# Patient Record
Sex: Male | Born: 1975 | Race: Black or African American | Hispanic: No | Marital: Married | Smoking: Never smoker
Health system: Southern US, Community
[De-identification: ages and names within clinical notes are randomized; demographics above are authoritative.]

## PROBLEM LIST (undated history)

## (undated) DIAGNOSIS — I10 Essential (primary) hypertension: Secondary | ICD-10-CM

## (undated) DIAGNOSIS — M109 Gout, unspecified: Secondary | ICD-10-CM

---

## 1997-11-30 ENCOUNTER — Emergency Department (HOSPITAL_COMMUNITY): Admission: EM | Admit: 1997-11-30 | Discharge: 1997-11-30 | Payer: Self-pay | Admitting: Emergency Medicine

## 2000-06-11 ENCOUNTER — Emergency Department (HOSPITAL_COMMUNITY): Admission: EM | Admit: 2000-06-11 | Discharge: 2000-06-11 | Payer: Self-pay | Admitting: Emergency Medicine

## 2000-06-13 ENCOUNTER — Emergency Department (HOSPITAL_COMMUNITY): Admission: EM | Admit: 2000-06-13 | Discharge: 2000-06-13 | Payer: Self-pay | Admitting: Emergency Medicine

## 2000-06-15 ENCOUNTER — Emergency Department (HOSPITAL_COMMUNITY): Admission: EM | Admit: 2000-06-15 | Discharge: 2000-06-15 | Payer: Self-pay | Admitting: Internal Medicine

## 2000-08-21 ENCOUNTER — Emergency Department (HOSPITAL_COMMUNITY): Admission: EM | Admit: 2000-08-21 | Discharge: 2000-08-21 | Payer: Self-pay | Admitting: *Deleted

## 2000-12-31 ENCOUNTER — Emergency Department (HOSPITAL_COMMUNITY): Admission: EM | Admit: 2000-12-31 | Discharge: 2001-01-01 | Payer: Self-pay | Admitting: Emergency Medicine

## 2001-06-29 ENCOUNTER — Emergency Department (HOSPITAL_COMMUNITY): Admission: EM | Admit: 2001-06-29 | Discharge: 2001-06-29 | Payer: Self-pay | Admitting: Emergency Medicine

## 2002-12-28 ENCOUNTER — Emergency Department (HOSPITAL_COMMUNITY): Admission: EM | Admit: 2002-12-28 | Discharge: 2002-12-29 | Payer: Self-pay | Admitting: Emergency Medicine

## 2003-10-21 ENCOUNTER — Emergency Department (HOSPITAL_COMMUNITY): Admission: EM | Admit: 2003-10-21 | Discharge: 2003-10-21 | Payer: Self-pay | Admitting: *Deleted

## 2003-10-23 ENCOUNTER — Emergency Department (HOSPITAL_COMMUNITY): Admission: EM | Admit: 2003-10-23 | Discharge: 2003-10-23 | Payer: Self-pay | Admitting: Emergency Medicine

## 2017-05-13 ENCOUNTER — Encounter (HOSPITAL_COMMUNITY): Payer: Self-pay | Admitting: Emergency Medicine

## 2017-05-13 ENCOUNTER — Ambulatory Visit (HOSPITAL_COMMUNITY)
Admission: EM | Admit: 2017-05-13 | Discharge: 2017-05-13 | Disposition: A | Discharge status: Home / Self Care | Payer: Self-pay | Attending: Emergency Medicine | Admitting: Emergency Medicine

## 2017-05-13 ENCOUNTER — Ambulatory Visit (INDEPENDENT_AMBULATORY_CARE_PROVIDER_SITE_OTHER): Payer: Self-pay

## 2017-05-13 DIAGNOSIS — M25461 Effusion, right knee: Secondary | ICD-10-CM

## 2017-05-13 DIAGNOSIS — S838X1A Sprain of other specified parts of right knee, initial encounter: Secondary | ICD-10-CM

## 2017-05-13 HISTORY — DX: Essential (primary) hypertension: I10

## 2017-05-13 MED ORDER — MELOXICAM 15 MG PO TABS: 15.0000 mg | ORAL_TABLET | Freq: Every day | ORAL | 0 refills | Status: AC

## 2017-05-13 NOTE — ED Provider Notes (Signed)
[facility]    CSN: [id number] Arrival date & time: 05/13/17  1810     History   Chief Complaint Chief Complaint  Patient presents with  . Leg Pain    HPI Benjamin Meyer is a 41 y.o. male.   41 year-old male, with history of HTN, presenting today complaining of right knee pain. He states that they recently moved and he was moving several things up and down the stairs. He has right sided medial knee pain since that time. walking up and down stairs exacerbates the pain. He has been taking motrin at home with mild relief. Denies redness, warmth, swelling in the right calf. Denies history of DVT/PE   The history is provided by the patient.  Leg Pain  Location:  Knee Time since incident:  1 month Injury: no   Knee location:  R knee Pain details:    Quality:  Aching   Radiates to:  Does not radiate   Severity:  Moderate   Onset quality:  Gradual   Duration:  1 month   Timing:  Intermittent   Progression:  Waxing and waning Chronicity:  New Dislocation: no   Foreign body present:  No foreign bodies Tetanus status:  Unknown Prior injury to area:  No Relieved by:  Nothing Worsened by:  Activity, flexion and exercise Ineffective treatments:  Rest Associated symptoms: stiffness   Associated symptoms: no back pain, no decreased ROM, no fatigue, no fever, no itching, no muscle weakness, no neck pain, no numbness, no swelling and no tingling   Risk factors: obesity   Risk factors: no concern for non-accidental trauma, no frequent fractures and no known bone disorder     Past Medical History:  Diagnosis Date  . Hypertension     There are no active problems to display for this patient.   History reviewed. No pertinent surgical history.     Home Medications    Prior to Admission medications   Medication Sig Start Date End Date Taking? Authorizing Provider  lisinopril-hydrochlorothiazide (PRINZIDE,ZESTORETIC) 20-12.5 MG tablet Take 1 tablet by mouth  daily.   Yes [provider]  meloxicam (MOBIC) 15 MG tablet Take 1 tablet (15 mg total) by mouth daily. 05/13/17 06/12/17  Alecia Lemming, PA-C    Family History History reviewed. No pertinent family history.  Social History Social History  Substance Use Topics  . Smoking status: Never Smoker  . Smokeless tobacco: Never Used  . Alcohol use Yes     Comment: occ     Allergies   Patient has no known allergies.   Review of Systems Review of Systems  Constitutional: Negative for chills, fatigue and fever.  HENT: Negative for ear pain and sore throat.   Eyes: Negative for pain and visual disturbance.  Respiratory: Negative for cough and shortness of breath.   Cardiovascular: Negative for chest pain and palpitations.  Gastrointestinal: Negative for abdominal pain and vomiting.  Genitourinary: Negative for dysuria and hematuria.  Musculoskeletal: Positive for arthralgias (right knee ) and stiffness. Negative for back pain, joint swelling and neck pain.  Skin: Negative for color change, itching and rash.  Neurological: Negative for seizures and syncope.  All other systems reviewed and are negative.    Physical Exam Triage Vital Signs ED Triage Vitals [05/13/17 1850]  Enc Vitals Group     BP (!) 166/97     Pulse Rate (!) 102     Resp 18     Temp 97.6 F (36.4 C)  Temp Source Oral     SpO2 97 %     Weight      Height      Head Circumference      Peak Flow      Pain Score 7     Pain Loc      Pain Edu?      Excl. in GC?    No data found.   Updated Vital Signs BP (!) 166/97 (BP Location: Right Arm)   Pulse (!) 102   Temp 97.6 F (36.4 C) (Oral)   Resp 18   SpO2 97%   Visual Acuity Right Eye Distance:   Left Eye Distance:   Bilateral Distance:    Right Eye Near:   Left Eye Near:    Bilateral Near:     Physical Exam  Constitutional: He appears well-developed and well-nourished.  HENT:  Head: Normocephalic and atraumatic.  Eyes:  Conjunctivae are normal.  Neck: Neck supple.  Cardiovascular: Normal rate and regular rhythm.   No murmur heard. Pulmonary/Chest: Effort normal and breath sounds normal. No respiratory distress.  Abdominal: Soft. There is no tenderness.  Musculoskeletal: He exhibits no edema.       Right knee: He exhibits normal range of motion and normal patellar mobility. Tenderness found. Medial joint line tenderness noted.  Tenderness to palpation of the medial joint line of the right knee. No swelling, erythema, warmth noted. Pain with valgus stress. Negative anterior drawer.   Neurological: He is alert.  Skin: Skin is warm and dry.  Psychiatric: He has a normal mood and affect.  Nursing note and vitals reviewed.    UC Treatments / Results  Labs (all labs ordered are listed, but only abnormal results are displayed) Labs Reviewed - No data to display  EKG  EKG Interpretation None       Radiology Dg Knee Complete 4 Views Right  Result Date: 05/13/2017 CLINICAL DATA:  41 year old male with right knee pain. No known injury. EXAM: RIGHT KNEE - COMPLETE 4+ VIEW COMPARISON:  None. FINDINGS: There is no acute fracture or dislocation. The bones are well mineralized. No arthritic changes. Trace suprapatellar effusion noted. The soft tissues appear unremarkable. IMPRESSION: Negative. Electronically Signed   By: Elgie CollardArash  Radparvar M.D.   On: 05/13/2017 20:14    Procedures Procedures (including critical care time)  Medications Ordered in UC Medications - No data to display   Initial Impression / Assessment and Plan / UC Course  I have reviewed the triage vital signs and the nursing notes.  Pertinent labs & imaging results that were available during my care of the patient were reviewed by me and considered in my medical decision making (see chart for details).     Right knee pain after moving Positive valgus stress, likely injury to the medial meniscus - RICE, NSAIDs and ortho referral  Final  Clinical Impressions(s) / UC Diagnoses   Final diagnoses:  Sprain of medial meniscus of right knee, initial encounter    New Prescriptions New Prescriptions   MELOXICAM (MOBIC) 15 MG TABLET    Take 1 tablet (15 mg total) by mouth daily.     Controlled Substance Prescriptions [place] Controlled Substance Registry consulted? Not Applicable   Alecia LemmingBlue, Cadyn Fann C, [place]PA-C 05/13/17 2025

## 2017-05-13 NOTE — ED Triage Notes (Signed)
Pt sts right leg pain in lower part x 1 month since moving and going up and down stairs; pt sts mild swelling

## 2017-09-09 ENCOUNTER — Ambulatory Visit (HOSPITAL_COMMUNITY)
Admission: EM | Admit: 2017-09-09 | Discharge: 2017-09-09 | Disposition: A | Discharge status: Home / Self Care | Payer: Self-pay | Attending: Family Medicine | Admitting: Family Medicine

## 2017-09-09 ENCOUNTER — Encounter (HOSPITAL_COMMUNITY): Payer: Self-pay

## 2017-09-09 DIAGNOSIS — I10 Essential (primary) hypertension: Secondary | ICD-10-CM

## 2017-09-09 DIAGNOSIS — M109 Gout, unspecified: Secondary | ICD-10-CM

## 2017-09-09 MED ORDER — INDOMETHACIN 50 MG PO CAPS: 50.0000 mg | ORAL_CAPSULE | Freq: Two times a day (BID) | ORAL | 0 refills | Status: DC

## 2017-09-09 MED ORDER — METHYLPREDNISOLONE SODIUM SUCC 125 MG IJ SOLR
125.0000 mg | Freq: Once | INTRAMUSCULAR | Status: AC
  Administered 2017-09-09: 125 mg via INTRAMUSCULAR

## 2017-09-09 MED ORDER — LISINOPRIL-HYDROCHLOROTHIAZIDE 20-12.5 MG PO TABS: 1.0000 | ORAL_TABLET | Freq: Every day | ORAL | 1 refills | Status: DC

## 2017-09-09 MED ORDER — METHYLPREDNISOLONE SODIUM SUCC 125 MG IJ SOLR
INTRAMUSCULAR | Status: AC
  Filled 2017-09-09: qty 2

## 2017-09-09 NOTE — ED Provider Notes (Signed)
  [facility]   [id number] 09/09/17 Arrival Time: 1039  ASSESSMENT & PLAN:  1. Acute gout involving toe of left foot, unspecified cause   2. Essential hypertension     Meds ordered this encounter  Medications  . methylPREDNISolone sodium succinate (SOLU-MEDROL) 125 mg/2 mL injection 125 mg  . indomethacin (INDOCIN) 50 MG capsule    Sig: Take 1 capsule (50 mg total) by mouth 2 (two) times daily with a meal.    Dispense:  10 capsule    Refill:  0  . lisinopril-hydrochlorothiazide (PRINZIDE,ZESTORETIC) 20-12.5 MG tablet    Sig: Take 1 tablet by mouth daily.    Dispense:  30 tablet    Refill:  1   Will start his BP medicine gain. Recommend that he f/u here within one week for BP check. PCP information given.  Reviewed expectations re: course of current medical issues. Questions answered. Outlined signs and symptoms indicating need for more acute intervention. Patient verbalized understanding. After Visit Summary given.  SUBJECTIVE: History from: patient. Benjamin Meyer is a 42 y.o. male who reports localized pain to his L great toe. H/O gout "and it feels like it." Cannot remember last gout flare. OTC Tylenol without much help. Ambulatory. No injury reported. Day #2 of symptoms. No new medications. Associated symptoms: none reported. Extremity sensation changes or weakness: none. "I usually get a shot and that helps. Need a shot."  Also requests refill of HTN med. Has been out for several weeks or longer.  ROS: As per HPI.   OBJECTIVE:  Vitals:   09/09/17 1146 09/09/17 1149  BP:  (!) 190/119  Pulse: 90   Resp: 18   Temp: 97.8 F (36.6 C)   TempSrc: Oral   SpO2: 97%     General appearance: alert; no distress Extremities: no cyanosis or edema; symmetrical with no gross deformities; localized tenderness over his L great MTP joint with slight swelling and erythema CV: normal extremity capillary refill Skin: warm and dry Neurologic: normal gait but favors L  foot; normal symmetric reflexes in all extremities; normal sensation in all extremities Psychological: alert and cooperative; normal mood and affect  No Known Allergies  Past Medical History:  Diagnosis Date  . Hypertension    Social History   Socioeconomic History  . Marital status: Married    Spouse name: Not on file  . Number of children: Not on file  . Years of education: Not on file  . Highest education level: Not on file  Social Needs  . Financial resource strain: Not on file  . Food insecurity - worry: Not on file  . Food insecurity - inability: Not on file  . Transportation needs - medical: Not on file  . Transportation needs - non-medical: Not on file  Occupational History  . Not on file  Tobacco Use  . Smoking status: Never Smoker  . Smokeless tobacco: Never Used  Substance and Sexual Activity  . Alcohol use: Yes    Comment: occ  . Drug use: No  . Sexual activity: Not on file  Other Topics Concern  . Not on file  Social History Narrative  . Not on file    History reviewed. No pertinent surgical history.    Mardella LaymanHagler, Sabryna Lahm, MD 09/09/17 1233

## 2017-09-09 NOTE — ED Triage Notes (Signed)
Pt having a gout flare on his left foot since last night. Complains of swelling and pain. Has a hx of gout. No medication left. Would like an injection.

## 2017-09-09 NOTE — Discharge Instructions (Addendum)
Start taking your blood pressure medicines again.

## 2017-11-27 ENCOUNTER — Ambulatory Visit (HOSPITAL_COMMUNITY)
Admission: EM | Admit: 2017-11-27 | Discharge: 2017-11-27 | Disposition: A | Discharge status: Home / Self Care | Payer: Self-pay | Attending: Family Medicine | Admitting: Family Medicine

## 2017-11-27 ENCOUNTER — Other Ambulatory Visit: Payer: Self-pay

## 2017-11-27 ENCOUNTER — Encounter (HOSPITAL_COMMUNITY): Payer: Self-pay | Admitting: Emergency Medicine

## 2017-11-27 DIAGNOSIS — M109 Gout, unspecified: Secondary | ICD-10-CM

## 2017-11-27 DIAGNOSIS — I10 Essential (primary) hypertension: Secondary | ICD-10-CM

## 2017-11-27 MED ORDER — COLCHICINE 0.6 MG PO TABS: ORAL_TABLET | ORAL | 2 refills | Status: DC

## 2017-11-27 MED ORDER — INDOMETHACIN 50 MG PO CAPS: 50.0000 mg | ORAL_CAPSULE | Freq: Two times a day (BID) | ORAL | 0 refills | Status: DC

## 2017-11-27 MED ORDER — LISINOPRIL-HYDROCHLOROTHIAZIDE 20-12.5 MG PO TABS: 1.0000 | ORAL_TABLET | Freq: Every day | ORAL | 1 refills | Status: DC

## 2017-11-27 MED ORDER — METHYLPREDNISOLONE SODIUM SUCC 125 MG IJ SOLR
INTRAMUSCULAR | Status: AC
  Filled 2017-11-27: qty 2

## 2017-11-27 MED ORDER — METHYLPREDNISOLONE SODIUM SUCC 125 MG IJ SOLR
125.0000 mg | Freq: Once | INTRAMUSCULAR | Status: AC
  Administered 2017-11-27: 125 mg via INTRAMUSCULAR

## 2017-11-27 NOTE — ED Provider Notes (Signed)
[facility]   [id number] 11/27/17 Arrival Time: 1748  ASSESSMENT & PLAN:  1. Acute gout involving toe of left foot, unspecified cause   2. Essential hypertension    Meds ordered this encounter  Medications  . indomethacin (INDOCIN) 50 MG capsule    Sig: Take 1 capsule (50 mg total) by mouth 2 (two) times daily with a meal.    Dispense:  10 capsule    Refill:  0  . lisinopril-hydrochlorothiazide (PRINZIDE,ZESTORETIC) 20-12.5 MG tablet    Sig: Take 1 tablet by mouth daily.    Dispense:  30 tablet    Refill:  1  . colchicine 0.6 MG tablet    Sig: Take two tablets as one dose followed one hour later by one tablet.    Dispense:  3 tablet    Refill:  2  . methylPREDNISolone sodium succinate (SOLU-MEDROL) 125 mg/2 mL injection 125 mg   Encouraged that he establish care with PCP. May f/u here as needed.  Reviewed expectations re: course of current medical issues. Questions answered. Outlined signs and symptoms indicating need for more acute intervention. Patient verbalized understanding. After Visit Summary given.  SUBJECTIVE: History from: patient. Benjamin Meyer is a 42 y.o. male who reports persistent moderate pain of his left first toe that is stable; described as aching and throbbing without radiation. Onset: abrupt, 1-2 days ago. Injury/trama: no. Relieved by: nothing in particular. Worsened by: certain movements. Associated symptoms: none reported. Extremity sensation changes or weakness: none. Self treatment: tried OTCs without relief of pain. History of similar: yes, "feels like gout" Able to bear weight on foot with discomfort.  ROS: As per HPI.   OBJECTIVE:  Vitals:   11/27/17 1850  BP: (!) 170/114  Pulse: 85  Resp: 20  Temp: 98.3 F (36.8 C)  TempSrc: Oral  SpO2: 97%    General appearance: alert; no distress Extremities: warm and well perfused; symmetrical with no gross deformities; localized tenderness over his left first MTP joint with  mild swelling and no bruising; ROM: normal but with pain CV: normal extremity capillary refill Skin: warm and dry Neurologic: normal gait; normal symmetric reflexes in all extremities; normal sensation in all extremities Psychological: alert and cooperative; normal mood and affect  No Known Allergies  Past Medical History:  Diagnosis Date  . Hypertension    Social History   Socioeconomic History  . Marital status: Married    Spouse name: Not on file  . Number of children: Not on file  . Years of education: Not on file  . Highest education level: Not on file  Occupational History  . Not on file  Social Needs  . Financial resource strain: Not on file  . Food insecurity:    Worry: Not on file    Inability: Not on file  . Transportation needs:    Medical: Not on file    Non-medical: Not on file  Tobacco Use  . Smoking status: Never Smoker  . Smokeless tobacco: Never Used  Substance and Sexual Activity  . Alcohol use: Yes    Comment: occ  . Drug use: No  . Sexual activity: Not on file  Lifestyle  . Physical activity:    Days per week: Not on file    Minutes per session: Not on file  . Stress: Not on file  Relationships  . Social connections:    Talks on phone: Not on file    Gets together: Not on file    Attends religious service:  Not on file    Active member of club or organization: Not on file    Attends meetings of clubs or organizations: Not on file    Relationship status: Not on file  . Intimate partner violence:    Fear of current or ex partner: Not on file    Emotionally abused: Not on file    Physically abused: Not on file    Forced sexual activity: Not on file  Other Topics Concern  . Not on file  Social History Narrative  . Not on file   History reviewed. No pertinent family history. History reviewed. No pertinent surgical history.    Mardella Layman, MD 12/02/17 [phone]

## 2017-11-27 NOTE — ED Triage Notes (Signed)
The patient presented to the [facility] with a complaint of left foot pain that he believed to be gout. The patient denied any known injury and has a hx of gout.

## 2018-05-20 ENCOUNTER — Encounter (HOSPITAL_COMMUNITY): Payer: Self-pay | Admitting: Emergency Medicine

## 2018-05-20 ENCOUNTER — Ambulatory Visit (HOSPITAL_COMMUNITY)
Admission: EM | Admit: 2018-05-20 | Discharge: 2018-05-20 | Disposition: A | Discharge status: Home / Self Care | Payer: Self-pay | Attending: Family Medicine | Admitting: Family Medicine

## 2018-05-20 DIAGNOSIS — I10 Essential (primary) hypertension: Secondary | ICD-10-CM

## 2018-05-20 DIAGNOSIS — M109 Gout, unspecified: Secondary | ICD-10-CM

## 2018-05-20 HISTORY — DX: Gout, unspecified: M10.9

## 2018-05-20 MED ORDER — METHYLPREDNISOLONE SODIUM SUCC 125 MG IJ SOLR
INTRAMUSCULAR | Status: AC
  Filled 2018-05-20: qty 2

## 2018-05-20 MED ORDER — COLCHICINE 0.6 MG PO TABS: ORAL_TABLET | ORAL | 2 refills | Status: DC

## 2018-05-20 MED ORDER — INDOMETHACIN 50 MG PO CAPS: 50.0000 mg | ORAL_CAPSULE | Freq: Two times a day (BID) | ORAL | 0 refills | Status: DC

## 2018-05-20 MED ORDER — AMLODIPINE BESYLATE 10 MG PO TABS: 10.0000 mg | ORAL_TABLET | Freq: Every day | ORAL | 1 refills | Status: DC

## 2018-05-20 MED ORDER — METHYLPREDNISOLONE SODIUM SUCC 125 MG IJ SOLR
125.0000 mg | Freq: Once | INTRAMUSCULAR | Status: AC
  Administered 2018-05-20: 125 mg via INTRAMUSCULAR

## 2018-05-20 NOTE — ED Triage Notes (Signed)
Pt here for pain in left foot from gout; pt sts hx of same

## 2018-05-20 NOTE — ED Provider Notes (Signed)
[facility]   [id number] 05/20/18 Arrival Time: 1603  ASSESSMENT & PLAN:  1. Acute gout of left foot, unspecified cause   2. Uncontrolled hypertension    Refill gout medications. Long discussion over HTN and potential effects to his long-term health. Agrees to start Norvasc and follow up here for BP check. Will look for PCP. I placed him in our queue for PCP assistance.  Meds ordered this encounter  Medications  . amLODipine (NORVASC) 10 MG tablet    Sig: Take 1 tablet (10 mg total) by mouth daily.    Dispense:  30 tablet    Refill:  1  . indomethacin (INDOCIN) 50 MG capsule    Sig: Take 1 capsule (50 mg total) by mouth 2 (two) times daily with a meal.    Dispense:  10 capsule    Refill:  0  . colchicine 0.6 MG tablet    Sig: Take two tablets as one dose followed one hour later by one tablet.    Dispense:  3 tablet    Refill:  2  . methylPREDNISolone sodium succinate (SOLU-MEDROL) 125 mg/2 mL injection 125 mg    Follow-up Information    [facility] In 2 weeks.   Specialty:  Urgent Care Why:  For blood pressure recheck. Contact information: [address] [place] [address] [phone]  To recheck BP and check Cr.       Reviewed expectations re: course of current medical issues. Questions answered. Outlined signs and symptoms indicating need for more acute intervention. Patient verbalized understanding. After Visit Summary given.   SUBJECTIVE:  Benjamin Meyer is a 42 y.o. male who reports recurrence of gout of his L foot. Last episode approx 5-6 months ago and responded to treatment. Requests refills of his medications. Ambulatory with pain. Also keeping him up at night. Current symptoms with gradual onset over the past few days. No recent illnesses or new medications. Denies frequent alcohol use.  Also knows his BP has been elevated for quite some time. Is taking the same BP med that he has been on for  a few years. No regular check ups for BP. Does not have a PCP. He reports taking medications as instructed, no medication side effects noted, no chest pain on exertion, no dyspnea on exertion and no swelling of ankles. Denies symptoms of chest pain, palpations, orthopnea, nocturnal dyspnea, or LE edema. Weight stable. No LE edema.  Social History   Tobacco Use  Smoking Status Never Smoker  Smokeless Tobacco Never Used    ROS: As per HPI. All other systems negative.   OBJECTIVE:  Vitals:   05/20/18 1739  BP: (!) 215/125  Pulse: 95  Resp: 18  Temp: 98.3 F (36.8 C)  TempSrc: Oral  SpO2: 95%    Elevated BP noted.  General appearance: alert; no distress; morbidly obese Eyes: PERRLA; EOMI HENT: normocephalic; atraumatic Neck: supple Lungs: clear to auscultation bilaterally Heart: regular rate and rhythm without murmer Extremities: refuses to remove shoe "because I won't be able to get it back on"; visible ankle without swelling or erythema; FROM Skin: warm and dry Psychological: alert and cooperative; normal mood and affect  No Known Allergies  Past Medical History:  Diagnosis Date  . Gout   . Hypertension   No h/o kidney disease reported.  Social History   Socioeconomic History  . Marital status: Married    Spouse name: Not on file  . Number of children: Not  on file  . Years of education: Not on file  . Highest education level: Not on file  Occupational History  . Not on file  Social Needs  . Financial resource strain: Not on file  . Food insecurity:    Worry: Not on file    Inability: Not on file  . Transportation needs:    Medical: Not on file    Non-medical: Not on file  Tobacco Use  . Smoking status: Never Smoker  . Smokeless tobacco: Never Used  Substance and Sexual Activity  . Alcohol use: Yes    Comment: occ  . Drug use: No  . Sexual activity: Not on file  Lifestyle  . Physical activity:    Days per week: Not on file    Minutes per  session: Not on file  . Stress: Not on file  Relationships  . Social connections:    Talks on phone: Not on file    Gets together: Not on file    Attends religious service: Not on file    Active member of club or organization: Not on file    Attends meetings of clubs or organizations: Not on file    Relationship status: Not on file  . Intimate partner violence:    Fear of current or ex partner: Not on file    Emotionally abused: Not on file    Physically abused: Not on file    Forced sexual activity: Not on file  Other Topics Concern  . Not on file  Social History Narrative  . Not on file   FH: HTN.  History reviewed. No pertinent surgical history.    Mardella Layman, MD 05/21/18 [phone]

## 2019-01-18 ENCOUNTER — Ambulatory Visit (HOSPITAL_COMMUNITY)
Admission: EM | Admit: 2019-01-18 | Discharge: 2019-01-18 | Disposition: A | Discharge status: Home / Self Care | Payer: Self-pay | Attending: Family Medicine | Admitting: Family Medicine

## 2019-01-18 ENCOUNTER — Other Ambulatory Visit: Payer: Self-pay

## 2019-01-18 ENCOUNTER — Encounter (HOSPITAL_COMMUNITY): Payer: Self-pay | Admitting: *Deleted

## 2019-01-18 DIAGNOSIS — I10 Essential (primary) hypertension: Secondary | ICD-10-CM

## 2019-01-18 DIAGNOSIS — M5431 Sciatica, right side: Secondary | ICD-10-CM

## 2019-01-18 DIAGNOSIS — Z760 Encounter for issue of repeat prescription: Secondary | ICD-10-CM

## 2019-01-18 DIAGNOSIS — Z8739 Personal history of other diseases of the musculoskeletal system and connective tissue: Secondary | ICD-10-CM

## 2019-01-18 MED ORDER — INDOMETHACIN 50 MG PO CAPS: 50.0000 mg | ORAL_CAPSULE | Freq: Two times a day (BID) | ORAL | 0 refills | Status: DC

## 2019-01-18 MED ORDER — LISINOPRIL-HYDROCHLOROTHIAZIDE 20-12.5 MG PO TABS: 1.0000 | ORAL_TABLET | Freq: Every day | ORAL | 1 refills | Status: DC

## 2019-01-18 MED ORDER — AMLODIPINE BESYLATE 10 MG PO TABS: 10.0000 mg | ORAL_TABLET | Freq: Every day | ORAL | 1 refills | Status: DC

## 2019-01-18 MED ORDER — COLCHICINE 0.6 MG PO TABS: ORAL_TABLET | ORAL | 2 refills | Status: DC

## 2019-01-18 MED ORDER — IBUPROFEN 800 MG PO TABS: 800.0000 mg | ORAL_TABLET | Freq: Three times a day (TID) | ORAL | 0 refills | Status: DC | PRN

## 2019-01-18 MED ORDER — METHYLPREDNISOLONE 4 MG PO TBPK: ORAL_TABLET | ORAL | 0 refills | Status: DC

## 2019-01-18 NOTE — Discharge Instructions (Addendum)
Go back on the BP medicines Take gout medicine as needed Take the medrol pak as directed Take all of day one today This will help with the sciatica After finishing the medrol you may take ibuprofen as needed

## 2019-01-18 NOTE — ED Triage Notes (Signed)
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  C/O flare-up of pain x approx 1 month due to standing for long periods of time at work; has run out of Rx IBU.  Denies numbness/tingling.

## 2019-01-18 NOTE — ED Provider Notes (Signed)
[facility]    CSN: [id number] Arrival date & time: 01/18/19  1544      History   Chief Complaint Chief Complaint  Patient presents with  . Hip Pain    HPI Benjamin Meyer is a 43 y.o. male.   HPI  Has recurring sciatica.  He takes ibuprofen 800 mg for this.  This usually works for him.  He is out of this medication.  He is out of his blood pressure medicines.  He is out of his Medicines.  He is asking for refills of all of these things.  The importance of maintaining a relationship with a primary care doctor to get regular refills is emphasized to patient.  He states he stands for a long time at his job.  Currently has pain in his right posterior buttock that goes down the right posterior thigh.  Worse with activity.  Better with rest.  No injury, no fall, no trauma.  No bowel or bladder complaint.  No numbness or weakness.  Past Medical History:  Diagnosis Date  . Gout   . Hypertension     There are no active problems to display for this patient.   History reviewed. No pertinent surgical history.     Home Medications    Prior to Admission medications   Medication Sig Start Date End Date Taking? Authorizing Provider  amLODipine (NORVASC) 10 MG tablet Take 1 tablet (10 mg total) by mouth daily. 01/18/19   Eustace MooreNelson, Yvonne Sue, MD  colchicine 0.6 MG tablet Take two tablets as one dose followed one hour later by one tablet. 01/18/19   Eustace MooreNelson, Yvonne Sue, MD  ibuprofen (ADVIL) 800 MG tablet Take 1 tablet (800 mg total) by mouth every 8 (eight) hours as needed for moderate pain. 01/18/19   Eustace MooreNelson, Yvonne Sue, MD  indomethacin (INDOCIN) 50 MG capsule Take 1 capsule (50 mg total) by mouth 2 (two) times daily with a meal. 01/18/19   Eustace MooreNelson, Yvonne Sue, MD  lisinopril-hydrochlorothiazide (ZESTORETIC) 20-12.5 MG tablet Take 1 tablet by mouth daily. 01/18/19   Eustace MooreNelson, Yvonne Sue, MD  methylPREDNISolone (MEDROL DOSEPAK) 4 MG TBPK tablet tad 01/18/19   Eustace MooreNelson, Yvonne Sue, MD    Family  History Family History  Problem Relation Age of Onset  . Hypertension Mother   . Diabetes Mother   . Heart failure Father   . Hypertension Father     Social History Social History   Tobacco Use  . Smoking status: Never Smoker  . Smokeless tobacco: Never Used  Substance Use Topics  . Alcohol use: Yes    Comment: occasionally  . Drug use: No     Allergies   Patient has no known allergies.   Review of Systems Review of Systems  Constitutional: Negative for chills and fever.  HENT: Negative for ear pain and sore throat.   Eyes: Negative for pain and visual disturbance.  Respiratory: Negative for cough and shortness of breath.   Cardiovascular: Negative for chest pain and palpitations.  Gastrointestinal: Negative for abdominal pain and vomiting.  Genitourinary: Negative for dysuria and hematuria.  Musculoskeletal: Positive for back pain. Negative for arthralgias.  Skin: Negative for color change and rash.  Neurological: Negative for seizures and syncope.  All other systems reviewed and are negative.    Physical Exam Triage Vital Signs ED Triage Vitals  Enc Vitals Group     BP 01/18/19 1607 128/84     Pulse Rate 01/18/19 1607 91     Resp 01/18/19  1607 (!) 24     Temp 01/18/19 1607 98 F (36.7 C)     Temp Source 01/18/19 1607 Oral     SpO2 01/18/19 1607 96 %     Weight --      Height --      Head Circumference --      Peak Flow --      Pain Score 01/18/19 1609 4     Pain Loc --      Pain Edu? --      Excl. in [place]? --    No data found.  Updated Vital Signs BP 128/84   Pulse 91   Temp 98 F (36.7 C) (Oral)   Resp (!) 24   SpO2 96%     Physical Exam Constitutional:      General: He is not in acute distress.    Appearance: He is well-developed. He is obese.     Comments: Morbidly obese  HENT:     Head: Normocephalic and atraumatic.  Eyes:     Conjunctiva/sclera: Conjunctivae normal.     Pupils: Pupils are equal, round, and reactive to light.   Neck:     Musculoskeletal: Normal range of motion.  Cardiovascular:     Rate and Rhythm: Normal rate and regular rhythm.     Comments: Heart sounds distant Pulmonary:     Effort: Pulmonary effort is normal. No respiratory distress.     Breath sounds: Normal breath sounds.  Abdominal:     General: There is no distension.     Palpations: Abdomen is soft.  Musculoskeletal: Normal range of motion.        General: No swelling, tenderness or deformity.  Skin:    General: Skin is warm and dry.  Neurological:     Mental Status: He is alert.     Motor: No weakness.     Coordination: Coordination normal.     Gait: Gait normal.     Deep Tendon Reflexes: Reflexes normal.      UC Treatments / Results  Labs (all labs ordered are listed, but only abnormal results are displayed) Labs Reviewed - No data to display  EKG   Radiology No results found.  Procedures Procedures (including critical care time)  Medications Ordered in UC Medications - No data to display  Initial Impression / Assessment and Plan / UC Course  I have reviewed the triage vital signs and the nursing notes.  Pertinent labs & imaging results that were available during my care of the patient were reviewed by me and considered in my medical decision making (see chart for details).      Final Clinical Impressions(s) / UC Diagnoses   Final diagnoses:  Sciatica of right side  Essential hypertension  Personal history of gout     Discharge Instructions     Go back on the BP medicines Take gout medicine as needed Take the medrol pak as directed Take all of day one today This will help with the sciatica After finishing the medrol you may take ibuprofen as needed    ED Prescriptions    Medication Sig Dispense Auth. Provider   indomethacin (INDOCIN) 50 MG capsule Take 1 capsule (50 mg total) by mouth 2 (two) times daily with a meal. 10 capsule Raylene Everts, MD   colchicine 0.6 MG tablet Take two  tablets as one dose followed one hour later by one tablet. 3 tablet Raylene Everts, MD   amLODipine (NORVASC) 10 MG tablet  Take 1 tablet (10 mg total) by mouth daily. 30 tablet Eustace MooreNelson, Yvonne Sue, MD   lisinopril-hydrochlorothiazide (ZESTORETIC) 20-12.5 MG tablet Take 1 tablet by mouth daily. 30 tablet Eustace MooreNelson, Yvonne Sue, MD   ibuprofen (ADVIL) 800 MG tablet Take 1 tablet (800 mg total) by mouth every 8 (eight) hours as needed for moderate pain. 90 tablet Eustace MooreNelson, Yvonne Sue, MD   methylPREDNISolone (MEDROL DOSEPAK) 4 MG TBPK tablet tad 21 tablet Eustace MooreNelson, Yvonne Sue, MD     Controlled Substance Prescriptions [place] Controlled Substance Registry consulted? Not Applicable   Eustace MooreNelson, Yvonne Sue, MD 01/18/19 [phone]

## 2019-03-11 ENCOUNTER — Ambulatory Visit (HOSPITAL_COMMUNITY)
Admission: EM | Admit: 2019-03-11 | Discharge: 2019-03-11 | Disposition: A | Discharge status: Home / Self Care | Payer: Self-pay | Attending: Internal Medicine | Admitting: Internal Medicine

## 2019-03-11 ENCOUNTER — Other Ambulatory Visit: Payer: Self-pay

## 2019-03-11 ENCOUNTER — Encounter (HOSPITAL_COMMUNITY): Payer: Self-pay

## 2019-03-11 DIAGNOSIS — M25551 Pain in right hip: Secondary | ICD-10-CM

## 2019-03-11 DIAGNOSIS — I160 Hypertensive urgency: Secondary | ICD-10-CM

## 2019-03-11 DIAGNOSIS — Z202 Contact with and (suspected) exposure to infections with a predominantly sexual mode of transmission: Secondary | ICD-10-CM

## 2019-03-11 MED ORDER — CLONIDINE HCL 0.1 MG PO TABS
ORAL_TABLET | ORAL | Status: AC
  Filled 2019-03-11: qty 1

## 2019-03-11 MED ORDER — METHYLPREDNISOLONE 4 MG PO TBPK: ORAL_TABLET | ORAL | 0 refills | Status: DC

## 2019-03-11 MED ORDER — METRONIDAZOLE 500 MG PO TABS: 500.0000 mg | ORAL_TABLET | Freq: Two times a day (BID) | ORAL | 0 refills | Status: DC

## 2019-03-11 MED ORDER — CLONIDINE HCL 0.1 MG PO TABS
0.1000 mg | ORAL_TABLET | Freq: Once | ORAL | Status: AC
  Administered 2019-03-11: 0.1 mg via ORAL

## 2019-03-11 MED ORDER — IBUPROFEN 800 MG PO TABS: 800.0000 mg | ORAL_TABLET | Freq: Three times a day (TID) | ORAL | 0 refills | Status: DC | PRN

## 2019-03-11 NOTE — ED Triage Notes (Signed)
Patient presents to Urgent Care with complaints of right hip pain since 1 month. Pt reports it is sciatic pain and was seen one month ago for this and was prescribed medication that helped. Pt states it continues to hurt without the medication.

## 2019-03-11 NOTE — ED Provider Notes (Signed)
[facility]    CSN: [id number] Arrival date & time: 03/11/19  1800      History   Chief Complaint Chief Complaint  Patient presents with  . Hip Pain    HPI Benjamin Meyer is a 43 y.o. male a history of chronic right hip pain, hypertension on antihypertensive medications comes to urgent care with complaint of worsening right hip pain.  Patient has a history of sciatica which is usually controlled with Motrin.  Patient says that he has had worsening pain over the past few days.  Pain is in the right hip with no radiation into the legs.  Aggravated by prolonged standing or movement.  Relieved by NSAIDs.  Patient denies any numbness or tingling.  Sexual partner was evaluated for STDs and she tested positive for trichomonas.  Patient has no urethral discharge, dysuria urgency or frequency.  No testicular or groin pain.  Patient denies any headache, dizziness, chest pain or chest pressure.  HPI  Past Medical History:  Diagnosis Date  . Gout   . Hypertension     There are no active problems to display for this patient.   History reviewed. No pertinent surgical history.     Home Medications    Prior to Admission medications   Medication Sig Start Date End Date Taking? Authorizing Provider  amLODipine (NORVASC) 10 MG tablet Take 1 tablet (10 mg total) by mouth daily. 01/18/19   Eustace MooreNelson, Yvonne Sue, MD  colchicine 0.6 MG tablet Take two tablets as one dose followed one hour later by one tablet. 01/18/19   Eustace MooreNelson, Yvonne Sue, MD  ibuprofen (ADVIL) 800 MG tablet Take 1 tablet (800 mg total) by mouth every 8 (eight) hours as needed for moderate pain. 01/18/19   Eustace MooreNelson, Yvonne Sue, MD  indomethacin (INDOCIN) 50 MG capsule Take 1 capsule (50 mg total) by mouth 2 (two) times daily with a meal. 01/18/19   Eustace MooreNelson, Yvonne Sue, MD  lisinopril-hydrochlorothiazide (ZESTORETIC) 20-12.5 MG tablet Take 1 tablet by mouth daily. 01/18/19   Eustace MooreNelson, Yvonne Sue, MD  methylPREDNISolone (MEDROL DOSEPAK)  4 MG TBPK tablet tad 01/18/19   Eustace MooreNelson, Yvonne Sue, MD    Family History Family History  Problem Relation Age of Onset  . Hypertension Mother   . Diabetes Mother   . Heart failure Father   . Hypertension Father     Social History Social History   Tobacco Use  . Smoking status: Never Smoker  . Smokeless tobacco: Never Used  Substance Use Topics  . Alcohol use: Yes    Comment: occasionally  . Drug use: No     Allergies   Patient has no known allergies.   Review of Systems Review of Systems  Constitutional: Negative.  Negative for activity change.  HENT: Negative.   Respiratory: Negative.   Cardiovascular: Negative.   Gastrointestinal: Negative.   Musculoskeletal: Positive for arthralgias. Negative for back pain, gait problem, joint swelling and myalgias.  Skin: Negative.   Neurological: Negative for dizziness, weakness, light-headedness and headaches.  Hematological: Negative.      Physical Exam Triage Vital Signs ED Triage Vitals  Enc Vitals Group     BP 03/11/19 1850 (!) 203/119     Pulse Rate 03/11/19 1839 84     Resp 03/11/19 1839 16     Temp 03/11/19 1839 (!) 97.4 F (36.3 C)     Temp Source 03/11/19 1839 Tympanic     SpO2 03/11/19 1839 97 %     Weight --  Height --      Head Circumference --      Peak Flow --      Pain Score 03/11/19 1839 6     Pain Loc --      Pain Edu? --      Excl. in [place]? --    No data found.  Updated Vital Signs BP (!) 203/119 (BP Location: Left Wrist)   Pulse 84   Temp (!) 97.4 F (36.3 C) (Tympanic)   Resp 16   SpO2 97%   Visual Acuity Right Eye Distance:   Left Eye Distance:   Bilateral Distance:    Right Eye Near:   Left Eye Near:    Bilateral Near:     Physical Exam Vitals signs and nursing note reviewed.  Constitutional:      Appearance: He is not ill-appearing or toxic-appearing.  Cardiovascular:     Rate and Rhythm: Normal rate and regular rhythm.     Pulses: Normal pulses.     Heart sounds:  Normal heart sounds.  Pulmonary:     Effort: Pulmonary effort is normal. No respiratory distress.     Breath sounds: Normal breath sounds. No wheezing or rhonchi.  Abdominal:     General: Bowel sounds are normal. There is no distension.     Palpations: Abdomen is soft.     Tenderness: There is no abdominal tenderness.  Musculoskeletal: Normal range of motion.        General: No swelling or deformity.  Skin:    General: Skin is warm.     Capillary Refill: Capillary refill takes less than 2 seconds.     Coloration: Skin is not jaundiced.     Findings: No bruising.  Neurological:     General: No focal deficit present.     Mental Status: He is alert.      UC Treatments / Results  Labs (all labs ordered are listed, but only abnormal results are displayed) Labs Reviewed - No data to display  EKG   Radiology No results found.  Procedures Procedures (including critical care time)  Medications Ordered in UC Medications - No data to display  Initial Impression / Assessment and Plan / UC Course  I have reviewed the triage vital signs and the nursing notes.  Pertinent labs & imaging results that were available during my care of the patient were reviewed by me and considered in my medical decision making (see chart for details).     1.  Hypertensive urgency: Clonidine 0.1 mg x 1 dose Elevated blood pressure may be secondary to anxiety over STD exposure and uncontrolled right hip pain Patient is advised to be compliant with his medication. If patient develops worsening blood pressure, headaches, dizziness, numbness, aphasia, chest pain or chest pressure he is advised to go to emergency department to be reevaluated and treated.  2.  Exposure to trichomonas vaginalis infection: Metronidazole 500 mg twice daily  3.  Chronic right hip pain: Medrol Dosepak Ibuprofen 800 mg every 6 hours as needed for pain  Final Clinical Impressions(s) / UC Diagnoses   Final diagnoses:  None    Discharge Instructions   None    ED Prescriptions    None     Controlled Substance Prescriptions [place] Controlled Substance Registry consulted? No   Chase Picket, MD 03/11/19 [phone]

## 2019-03-31 ENCOUNTER — Encounter (HOSPITAL_COMMUNITY): Payer: Self-pay | Admitting: Emergency Medicine

## 2019-03-31 ENCOUNTER — Other Ambulatory Visit: Payer: Self-pay

## 2019-03-31 ENCOUNTER — Ambulatory Visit (HOSPITAL_COMMUNITY)
Admission: EM | Admit: 2019-03-31 | Discharge: 2019-03-31 | Disposition: A | Discharge status: Home / Self Care | Payer: Self-pay | Attending: Emergency Medicine | Admitting: Emergency Medicine

## 2019-03-31 DIAGNOSIS — Z202 Contact with and (suspected) exposure to infections with a predominantly sexual mode of transmission: Secondary | ICD-10-CM

## 2019-03-31 DIAGNOSIS — I10 Essential (primary) hypertension: Secondary | ICD-10-CM

## 2019-03-31 DIAGNOSIS — B370 Candidal stomatitis: Secondary | ICD-10-CM

## 2019-03-31 MED ORDER — FLUCONAZOLE 100 MG PO TABS: ORAL_TABLET | ORAL | 0 refills | Status: DC

## 2019-03-31 MED ORDER — ONDANSETRON 4 MG PO TBDP
8.0000 mg | ORAL_TABLET | Freq: Once | ORAL | Status: AC
  Administered 2019-03-31: 8 mg via ORAL

## 2019-03-31 MED ORDER — ONDANSETRON 4 MG PO TBDP
ORAL_TABLET | ORAL | Status: AC
  Filled 2019-03-31: qty 2

## 2019-03-31 MED ORDER — FAMOTIDINE 20 MG PO TABS
ORAL_TABLET | ORAL | Status: AC
  Filled 2019-03-31: qty 2

## 2019-03-31 MED ORDER — NYSTATIN 100000 UNIT/ML MT SUSP: 500000.0000 [IU] | Freq: Four times a day (QID) | OROMUCOSAL | 0 refills | Status: DC

## 2019-03-31 MED ORDER — METRONIDAZOLE 500 MG PO TABS
2000.0000 mg | ORAL_TABLET | Freq: Once | ORAL | Status: AC
  Administered 2019-03-31: 2000 mg via ORAL

## 2019-03-31 MED ORDER — METRONIDAZOLE 500 MG PO TABS
ORAL_TABLET | ORAL | Status: AC
  Filled 2019-03-31: qty 4

## 2019-03-31 MED ORDER — FAMOTIDINE 20 MG PO TABS
40.0000 mg | ORAL_TABLET | Freq: Once | ORAL | Status: AC
  Administered 2019-03-31: 40 mg via ORAL

## 2019-03-31 NOTE — ED Provider Notes (Signed)
HPI  SUBJECTIVE:  Benjamin Meyer is a 43 y.o. male who presents with white patches on his tongue and a burning itching tongue, oral burning for the past 3 or 4 days.  States it feels like he burned his mouth with coffee.  Slightly sore throat.  He states his symptoms started several days after taking Flagyl for presumed trichomoniasis.  He denies loss of taste.  He denies lip or tongue swelling, difficulty breathing, shortness of breath, urticaria, rash.  No alleviating factors.  He has not tried anything for this.  Symptoms are worse when he drinks carbonated beverages.  He was seen here on 8/26, sent home with a prescription of Flagyl for trichomonas exposure.  States that he took it for 3 days, but it made him extremely nauseous.  Patient admits to drinking while taking the Flagyl.  He states that he did not know that he was not supposed to drink while taking the Flagyl.  He is also concerned that he may still possibly have trichomonas and is requesting treatment.  Past medical history of chronic right hip pain, recurring sciatica, hypertension, frequent yeast infections whenever he takes antibiotics.  No history of diabetes.  PMD: None.   Past Medical History:  Diagnosis Date  . Gout   . Hypertension     History reviewed. No pertinent surgical history.  Family History  Problem Relation Age of Onset  . Hypertension Mother   . Diabetes Mother   . Heart failure Father   . Hypertension Father     Social History   Tobacco Use  . Smoking status: Never Smoker  . Smokeless tobacco: Never Used  Substance Use Topics  . Alcohol use: Yes    Comment: occasionally  . Drug use: No     Current Facility-Administered Medications:  .  famotidine (PEPCID) tablet 40 mg, 40 mg, Oral, Once, Domenick GongMortenson, Latitia Housewright, MD .  metroNIDAZOLE (FLAGYL) tablet 2,000 mg, 2,000 mg, Oral, Once, Domenick GongMortenson, Tecora Eustache, MD .  ondansetron (ZOFRAN-ODT) disintegrating tablet 8 mg, 8 mg, Oral, Once, Domenick GongMortenson, Meeghan Skipper,  MD  Current Outpatient Medications:  .  amLODipine (NORVASC) 10 MG tablet, Take 1 tablet (10 mg total) by mouth daily., Disp: 30 tablet, Rfl: 1 .  colchicine 0.6 MG tablet, Take two tablets as one dose followed one hour later by one tablet., Disp: 3 tablet, Rfl: 2 .  fluconazole (DIFLUCAN) 100 MG tablet, 200 mg by mouth on day #1, then 100 mg by mouth once daily on days 2 through 7, Disp: 8 tablet, Rfl: 0 .  ibuprofen (ADVIL) 800 MG tablet, Take 1 tablet (800 mg total) by mouth every 8 (eight) hours as needed for moderate pain., Disp: 90 tablet, Rfl: 0 .  lisinopril-hydrochlorothiazide (ZESTORETIC) 20-12.5 MG tablet, Take 1 tablet by mouth daily., Disp: 30 tablet, Rfl: 1 .  methylPREDNISolone (MEDROL DOSEPAK) 4 MG TBPK tablet, tad, Disp: 21 tablet, Rfl: 0 .  nystatin (MYCOSTATIN) 100000 UNIT/ML suspension, Take 5 mLs (500,000 Units total) by mouth 4 (four) times daily., Disp: 60 mL, Rfl: 0  No Known Allergies   ROS  As noted in HPI.   Physical Exam  BP (!) 186/99 (BP Location: Left Arm)   Pulse 99   Temp 98.3 F (36.8 C) (Oral)   Resp 18   SpO2 94%   Constitutional: Well developed, well nourished, no acute distress Eyes:  EOMI, conjunctiva normal bilaterally HENT: Normocephalic, atraumatic,mucus membranes moist.  Positive white patches on the tongue, erythematous tongue, slightly erythematous oropharynx.  Tonsils normal  without exudates.  No plaques on the buccal mucosa, roof of the mouth. Respiratory: Normal inspiratory effort Cardiovascular: Normal rate GI: nondistended skin: No rash, skin intact Musculoskeletal: no deformities Neurologic: Alert & oriented x 3, no focal neuro deficits Psychiatric: Speech and behavior appropriate   ED Course   Medications  metroNIDAZOLE (FLAGYL) tablet 2,000 mg (has no administration in time range)  ondansetron (ZOFRAN-ODT) disintegrating tablet 8 mg (has no administration in time range)  famotidine (PEPCID) tablet 40 mg (has no  administration in time range)  famotidine (PEPCID) 20 MG tablet (has no administration in time range)  metroNIDAZOLE (FLAGYL) 500 MG tablet (has no administration in time range)  ondansetron (ZOFRAN-ODT) 4 MG disintegrating tablet (has no administration in time range)    No orders of the defined types were placed in this encounter.   No results found for this or any previous visit (from the past 24 hour(s)). No results found.  ED Clinical Impression  1. Oral thrush   2. Exposure to trichomonas   3. Essential hypertension      ED Assessment/Plan  1.  Exposure to trichomonas.  Patient did not finish the Flagyl that was prescribed to him on his last visit.  Suspect it was because he was drinking while taking the Flagyl.  We will treat him with 2 g of Flagyl p.o. x1, Zofran 8 mg, Pepcid 40 mg.  Advised him to not drink for the next 3 days.  2.  Oral thrush.  Most likely from the Flagyl.  Sending home with nystatin thousand units 5 mg 4 times daily swish and swallow, and if this does not work, Diflucan 200 mg once daily then 100 mg once a day for the next 6 days for total of 7 days of Diflucan.  3.  Hypertension.  Blood pressure noted.  Patient is completely asymptomatic.  States that he is compliant with his medications. Pt has no historical evidence of end organ damage. Pt denies any CNS type sx such as HA, visual changes, focal paresis, or new onset seizure activity. Pt denies any CV sx such as CP, dyspnea, palpitations, pedal edema, tearing pain radiating to back or abd. Pt denied any renal sx such as anuria or hematuria. Pt to f/u with a primary care provider of his choice as OP.  Providing primary care list for ongoing care.  He did not mention foot pain to me.  Discussed  MDM, treatment plan, and plan for follow-up with patient. Discussed sn/sx that should prompt return to the ED. patient agrees with plan.   Meds ordered this encounter  Medications  . metroNIDAZOLE (FLAGYL) tablet  2,000 mg  . ondansetron (ZOFRAN-ODT) disintegrating tablet 8 mg  . famotidine (PEPCID) tablet 40 mg  . nystatin (MYCOSTATIN) 100000 UNIT/ML suspension    Sig: Take 5 mLs (500,000 Units total) by mouth 4 (four) times daily.    Dispense:  60 mL    Refill:  0  . fluconazole (DIFLUCAN) 100 MG tablet    Sig: 200 mg by mouth on day #1, then 100 mg by mouth once daily on days 2 through 7    Dispense:  8 tablet    Refill:  0    *This clinic note was created using [employer]. Therefore, there may be occasional mistakes despite careful proofreading.   ?  Melynda Ripple, MD 03/31/19 1945

## 2019-03-31 NOTE — ED Triage Notes (Signed)
Pt here for right foot pain from sciatica per pt; pt sts trouble taking meds given at last visit

## 2019-03-31 NOTE — Discharge Instructions (Addendum)
Do not take the colchicine while you are taking the Diflucan.  We have treated you for possible trichomoniasis with 2 g of Flagyl today.  Do not drink for the next 3 days.  If the nystatin does not work in several days, go ahead and start the Diflucan.  Please keep an eye on your blood pressure.  Make sure you take your medications on a regular basis. Decrease your salt intake. diet and exercise will lower your blood pressure significantly. It is important to keep your blood pressure under good control, as having a elevated blood pressure for prolonged periods of time significantly increases your risk of stroke, heart attacks, kidney damage, eye damage, and other problems. Measure your blood pressure once a day, preferably at the same time every day. Keep a log of this and bring it to your next doctor's appointment.  Bring your blood pressure cuff as well.  You may return here in 2 weeks if your blood pressure stays significantly elevated despite the medications that you are on.  Return immediately to the ER if you start having chest pain, headache, problems seeing, problems talking, problems walking, if you feel like you're about to pass out, if you do pass out, if you have a seizure, or for any other concerns.  Below is a list of primary care practices who are taking new patients for you to follow-up with.  [facility] at [facility] [address] [place], [place] [address] [phone]  [place], [place] [address] [phone]  [facility] [phone] [place] [address]  [facility]: [place]  [phone]  [place]: [place]   [phone]  [facility]: [address] [place]  [phone]  [place] [facility] : [address] [place] [phone]  [facility]: [address] [place] [address] [phone]  [facility]: [place] [phone]  Dr. Blanchie Serve [place]  [phone]  Dr. Benito Mccreedy, [facility]. [place], [place] [address]  [phone]  Go to www.goodrx.com to look up your medications. This will give you a list of where you can find your prescriptions at the most affordable prices. Or ask the pharmacist what the cash price is, or if they have any other discount programs available to help make your medication more affordable. This can be less expensive than what you would pay with insurance.

## 2019-06-30 ENCOUNTER — Telehealth: Payer: Self-pay | Admitting: Physician Assistant

## 2019-06-30 DIAGNOSIS — J029 Acute pharyngitis, unspecified: Secondary | ICD-10-CM

## 2019-06-30 MED ORDER — NYSTATIN 100000 UNIT/ML MT SUSP: 5.0000 mL | Freq: Four times a day (QID) | OROMUCOSAL | 0 refills | Status: DC

## 2019-06-30 MED ORDER — AMOXICILLIN 500 MG PO CAPS: 500.0000 mg | ORAL_CAPSULE | Freq: Two times a day (BID) | ORAL | 0 refills | Status: AC

## 2019-06-30 NOTE — Progress Notes (Signed)
Patient's records reviewed.  He was evaluated at urgent care in September 2020 and diagnosed with oral thrush after taking Flagyl.  History and physical at that time somewhat consistent with today's symptoms.  Given the time of year and increase in strep pharyngitis this may be the cause of patient's symptoms and he will be given amoxicillin.  As he has had thrush after antibiotic usage in the past I will also prescribe Diflucan.  Record indicates no current history of diabetes or HIV however if symptoms persist he will need further work-up.   We are sorry that you are not feeling well.  Here is how we plan to help!  Based on what you have shared with me it is likely that you have strep pharyngitis or oral thrush.  Given that you were treated for oral thrush in September and the potential for strep pharyngitis today, we will treat for both.  Strep pharyngitis is inflammation and infection in the back of the throat.  This is an infection cause by bacteria and is treated with antibiotics.  I have prescribed Amoxicillin 500 mg twice a day for 10 days. For throat pain, we recommend over the counter oral pain relief medications such as acetaminophen or aspirin, or anti-inflammatory medications such as ibuprofen or naproxen sodium. Topical treatments such as oral throat lozenges or sprays may be used as needed. Strep infections are not as easily transmitted as other respiratory infections, however we still recommend that you avoid close contact with loved ones, especially the very young and elderly.  Remember to wash your hands thoroughly throughout the day as this is the number one way to prevent the spread of infection and wipe down door knobs and counters with disinfectant.  As there are other, more serious medical problems that can cause thrush it is important that you see your primary care provider or urgent care if you are not improved in several days.  I will prescribe oral nystatin for your  thrush.   Home Care:  Only take medications as instructed by your medical team.  Complete the entire course of an antibiotic.  Do not take these medications with alcohol.  A steam or ultrasonic humidifier can help congestion.  You can place a towel over your head and breathe in the steam from hot water coming from a faucet.  Avoid close contacts especially the very young and the elderly.  Cover your mouth when you cough or sneeze.  Always remember to wash your hands.  Get Help Right Away If:  You develop worsening fever or sinus pain.  You develop a severe head ache or visual changes.  Your symptoms persist after you have completed your treatment plan.  Make sure you  Understand these instructions.  Will watch your condition.  Will get help right away if you are not doing well or get worse.  Your e-visit answers were reviewed by a board certified advanced clinical practitioner to complete your personal care plan.  Depending on the condition, your plan could have included both over the counter or prescription medications.  If there is a problem please reply  once you have received a response from your provider.  Your safety is important to [place].  If you have drug allergies check your prescription carefully.    You can use MyChart to ask questions about today's visit, request a non-urgent call back, or ask for a work or school excuse for 24 hours related to this e-Visit. If it has been greater than  24 hours you will need to follow up with your provider, or enter a new e-Visit to address those concerns.  You will get an e-mail in the next two days asking about your experience.  I hope that your e-visit has been valuable and will speed your recovery. Thank you for using e-visits.  Greater than 5 minutes, yet less than 10 minutes of time have been spent researching, coordinating, and implementing care for this patient today

## 2019-07-08 ENCOUNTER — Ambulatory Visit (HOSPITAL_COMMUNITY)
Admission: EM | Admit: 2019-07-08 | Discharge: 2019-07-08 | Disposition: A | Discharge status: Home / Self Care | Payer: Self-pay | Attending: Emergency Medicine | Admitting: Emergency Medicine

## 2019-07-08 ENCOUNTER — Other Ambulatory Visit: Payer: Self-pay

## 2019-07-08 ENCOUNTER — Telehealth: Payer: Self-pay | Admitting: Family

## 2019-07-08 ENCOUNTER — Encounter (HOSPITAL_COMMUNITY): Payer: Self-pay | Admitting: Emergency Medicine

## 2019-07-08 DIAGNOSIS — K1370 Unspecified lesions of oral mucosa: Secondary | ICD-10-CM

## 2019-07-08 DIAGNOSIS — K148 Other diseases of tongue: Secondary | ICD-10-CM

## 2019-07-08 MED ORDER — TRIAMCINOLONE ACETONIDE 0.1 % MT PSTE: 1.0000 "application " | PASTE | Freq: Two times a day (BID) | OROMUCOSAL | 12 refills | Status: DC

## 2019-07-08 NOTE — ED Provider Notes (Signed)
[facility]    CSN: [id number] Arrival date & time: 07/08/19  1613      History   Chief Complaint Chief Complaint  Patient presents with  . tongue problem    HPI Benjamin Meyer is a 43 y.o. male.   Benjamin Meyer 28 y old presented to the urgent care for complaint of recurrent thrush for the past 5 days.  Patient was previously seen for E visit where he was prescribed nystatin and amoxicillin.  Patient states the thrush resolved but the spot on his tongue was not resolved.  Patient reports a combination of Diflucan and nystatin was effective.  Eating spicy foods make her symptoms worse.  Denies recent travel.  Denies aggravating or alleviating symptoms. Denies fever, chills, fatigue, nasal congestion, rhinorrhea, sore throat, cough, SOB, wheezing, chest pain, nausea, vomiting, changes in bowel or bladder habits.    The history is provided by the patient. No language interpreter was used.    Past Medical History:  Diagnosis Date  . Gout   . Hypertension     There are no problems to display for this patient.   History reviewed. No pertinent surgical history.     Home Medications    Prior to Admission medications   Medication Sig Start Date End Date Taking? Authorizing Provider  amoxicillin (AMOXIL) 500 MG capsule Take 1 capsule (500 mg total) by mouth 2 (two) times daily for 10 days. 06/30/19 07/10/19 Yes Muthersbaugh, Dahlia Client, PA-C  nystatin (MYCOSTATIN) 100000 UNIT/ML suspension Use as directed 5 mLs (500,000 Units total) in the mouth or throat 4 (four) times daily. 06/30/19  Yes Muthersbaugh, Dahlia Client, PA-C  amLODipine (NORVASC) 10 MG tablet Take 1 tablet (10 mg total) by mouth daily. 01/18/19   Eustace Moore, MD  colchicine 0.6 MG tablet Take two tablets as one dose followed one hour later by one tablet. 01/18/19   Eustace Moore, MD  fluconazole (DIFLUCAN) 100 MG tablet 200 mg by mouth on day #1, then 100 mg by mouth once daily on days 2 through 7  03/31/19   Domenick Gong, MD  ibuprofen (ADVIL) 800 MG tablet Take 1 tablet (800 mg total) by mouth every 8 (eight) hours as needed for moderate pain. 03/11/19   Merrilee Jansky, MD  lisinopril-hydrochlorothiazide (ZESTORETIC) 20-12.5 MG tablet Take 1 tablet by mouth daily. 01/18/19   Eustace Moore, MD  methylPREDNISolone (MEDROL DOSEPAK) 4 MG TBPK tablet tad 03/11/19   Lamptey, Britta Mccreedy, MD  nystatin (MYCOSTATIN) 100000 UNIT/ML suspension Take 5 mLs (500,000 Units total) by mouth 4 (four) times daily. 03/31/19   Domenick Gong, MD  triamcinolone (KENALOG) 0.1 % paste Use as directed 1 application in the mouth or throat 2 (two) times daily. 07/08/19   Durward Parcel, FNP    Family History Family History  Problem Relation Age of Onset  . Hypertension Mother   . Diabetes Mother   . Heart failure Father   . Hypertension Father     Social History Social History   Tobacco Use  . Smoking status: Never Smoker  . Smokeless tobacco: Never Used  Substance Use Topics  . Alcohol use: Yes    Comment: occasionally  . Drug use: No     Allergies   Patient has no known allergies.   Review of Systems Review of Systems  Constitutional: Negative.   HENT: Positive for mouth sores.   Respiratory: Negative.   Cardiovascular: Negative.   All other systems reviewed and are negative.  Physical Exam Triage Vital Signs ED Triage Vitals  Enc Vitals Group     BP      Pulse      Resp      Temp      Temp src      SpO2      Weight      Height      Head Circumference      Peak Flow      Pain Score      Pain Loc      Pain Edu?      Excl. in [place]?    No data found.  Updated Vital Signs BP (!) 159/100 (BP Location: Right Arm)   Pulse 93   Temp 97.9 F (36.6 C) (Oral)   Resp 16   SpO2 99%   Visual Acuity Right Eye Distance:   Left Eye Distance:   Bilateral Distance:    Right Eye Near:   Left Eye Near:    Bilateral Near:     Physical Exam Constitutional:       Appearance: Normal appearance. He is normal weight.  HENT:     Mouth/Throat:     Tongue: Lesions present.  Cardiovascular:     Rate and Rhythm: Normal rate and regular rhythm.     Pulses: Normal pulses.     Heart sounds: Normal heart sounds.  Pulmonary:     Effort: Pulmonary effort is normal.     Breath sounds: Normal breath sounds.  Neurological:     Mental Status: He is alert.        UC Treatments / Results  Labs (all labs ordered are listed, but only abnormal results are displayed) Labs Reviewed - No data to display  EKG   Radiology No results found.  Procedures Procedures (including critical care time)  Medications Ordered in UC Medications - No data to display  Initial Impression / Assessment and Plan / UC Course  I have reviewed the triage vital signs and the nursing notes.  Pertinent labs & imaging results that were available during my care of the patient were reviewed by me and considered in my medical decision making (see chart for details).    Patient stable for discharge.  Benign physical exam.  Will treat patient with Kenalog.  Advised patient to follow-up with a dentist.  To return for worsening of symptoms.  Patient verbalized understanding of the plan of care.  Final Clinical Impressions(s) / UC Diagnoses   Final diagnoses:  Tongue lesion     Discharge Instructions     Advised patient to use medication as prescribed Advised patient to follow-up with dentist  to return for worsening of symptoms    ED Prescriptions    Medication Sig Dispense Auth. Provider   triamcinolone (KENALOG) 0.1 % paste Use as directed 1 application in the mouth or throat 2 (two) times daily. 5 g Emerson Monte, FNP     PDMP not reviewed this encounter.   Emerson Monte, [place] 07/08/19 1734

## 2019-07-08 NOTE — ED Triage Notes (Signed)
Patient presents to Urgent Care with complaints of white irritating patch/ bump on the tip of his tongue since three days ago. Patient reports he was recently treated for strep throat, not sure if it is due to the medication he was on.

## 2019-07-08 NOTE — Progress Notes (Signed)
Based on what you shared with me, I feel your condition warrants further evaluation and I recommend that you be seen for a face to face office visit.  Given that you are currently being treated for strep throat and now have sores on your tongue, you need to be seen face to face to rule out more serious infections. Different type of infections can cause lesions on our tongue.    NOTE: If you entered your credit card information for this eVisit, you will not be charged. You may see a "hold" on your card for the $35 but that hold will drop off and you will not have a charge processed.   If you are having a true medical emergency please call 911.      For an urgent face to face visit, [facility] has five urgent care centers for your convenience:      NEW:  [facility] at [place] Get Driving Directions [phone] [place], [place] [address] . 10 am - 6pm Monday - Friday    [facility]) Get Driving Directions [phone] [address] [place], [place] [address] . 10 am to 8 pm Monday-Friday . 12 pm to 8 pm [facility] Get Driving Directions [phone] [place], [place], [place] [address] . 8 am to 8 pm Monday-Friday . 9 am to 6 pm Saturday . 11 am to 6 pm Sunday     [facility] at [facility] Get Driving Directions  [phone] [address], [place] [address] . 8 am to 8 pm Monday-Friday . 8 am to 4 pm [facility] at [place] Get Driving Directions [phone] [address] [place], [place] [address] . 12 pm to 6 pm Monday-Friday      Your e-visit answers were reviewed by a board certified advanced clinical practitioner to complete your personal care plan.  Thank you for using e-Visits.

## 2019-07-08 NOTE — Discharge Instructions (Addendum)
Advised patient to use medication as prescribed Advised patient to follow-up with dentist  Advised patient to complete nystatin course to return for worsening of symptoms

## 2019-07-12 ENCOUNTER — Telehealth: Payer: Self-pay | Admitting: Physician Assistant

## 2019-07-12 DIAGNOSIS — J312 Chronic pharyngitis: Secondary | ICD-10-CM

## 2019-07-12 DIAGNOSIS — K1370 Unspecified lesions of oral mucosa: Secondary | ICD-10-CM

## 2019-07-12 NOTE — Progress Notes (Signed)
Based on what you shared with me, I feel your condition warrants further evaluation and I recommend that you be seen for a face to face visit.   I see you have had multiple visits recently via e-visit and Urgent Care for tongue lesion/sore throat. Since symptoms are ongoing you need to be seen in person for a detailed exam to determine if there is thrush present (which would warrant need for diflucan or similar medication) or another cause of symptoms. Also if you have history of recurring yeast in the throat you may need a further workup to see why this is.    Please contact your primary care physician practice to be seen. Many offices offer virtual options to be seen via video if you are not comfortable going in person to a medical facility at this time.  If you do not have a PCP, [facility] offers a free physician referral service available at [phone]. Our trained staff has the experience, knowledge and resources to put you in touch with a physician who is right for you.   You also have the option of a video visit through [email or web address]  If you are having a true medical emergency please call 911.  NOTE: If you entered your credit card information for this eVisit, you will not be charged. You may see a "hold" on your card for the $35 but that hold will drop off and you will not have a charge processed.  Your e-visit answers were reviewed by a board certified advanced clinical practitioner to complete your personal care plan.  Thank you for using e-Visits.

## 2019-08-11 ENCOUNTER — Telehealth: Payer: Self-pay | Admitting: Nurse Practitioner

## 2019-08-11 DIAGNOSIS — M5431 Sciatica, right side: Secondary | ICD-10-CM

## 2019-08-11 MED ORDER — IBUPROFEN 800 MG PO TABS: 800.0000 mg | ORAL_TABLET | Freq: Three times a day (TID) | ORAL | 0 refills | Status: DC | PRN

## 2019-08-11 NOTE — Progress Notes (Signed)
We are sorry that you are not feeling well.  Here is how we plan to help!  Based on what you have shared with me it looks like you mostly have acute back pain. With radiation down back of leg, that indicates sciatica. Sciatica is an inflammation of main nerves that causes pain to radiate dwon the back of leg.  Acute back pain is defined as musculoskeletal pain that can resolve in 1-3 weeks with conservative treatment.  I have prescribed motrin 800mg   non-steroid anti-inflammatory (NSAID)SUBJECTIVE: Some patients experience stomach irritation or in increased heartburn with anti-inflammatory drugs.  Please keep in mind that muscle relaxer's can cause fatigue and should not be taken while at work or driving.  Back pain is very common.  The pain often gets better over time.  The cause of back pain is usually not dangerous.  Most people can learn to manage their back pain on their own.  Home Care  Stay active.  Start with short walks on flat ground if you can.  Try to walk farther each day.  Do not sit, drive or stand in one place for more than 30 minutes.  Do not stay in bed.  Do not avoid exercise or work.  Activity can help your back heal faster.  Be careful when you bend or lift an object.  Bend at your knees, keep the object close to you, and do not twist.  Sleep on a firm mattress.  Lie on your side, and bend your knees.  If you lie on your back, put a pillow under your knees.  Only take medicines as told by your doctor.  Put ice on the injured area.  Put ice in a plastic bag  Place a towel between your skin and the bag  Leave the ice on for 15-20 minutes, 3-4 times a day for the first 2-3 days. 210 After that, you can switch between ice and heat packs.  Ask your doctor about back exercises or massage.  Avoid feeling anxious or stressed.  Find good ways to deal with stress, such as exercise.  Get Help Right Way If:  Your pain does not go away with rest or medicine.  Your pain  does not go away in 1 week.  You have new problems.  You do not feel well.  The pain spreads into your legs.  You cannot control when you poop (bowel movement) or pee (urinate)  You feel sick to your stomach (nauseous) or throw up (vomit)  You have belly (abdominal) pain.  You feel like you may pass out (faint).  If you develop a fever.  Make Sure you:  Understand these instructions.  Will watch your condition  Will get help right away if you are not doing well or get worse.  Your e-visit answers were reviewed by a board certified advanced clinical practitioner to complete your personal care plan.  Depending on the condition, your plan could have included both over the counter or prescription medications.  If there is a problem please reply  once you have received a response from your provider.  Your safety is important to [place].  If you have drug allergies check your prescription carefully.    You can use MyChart to ask questions about today's visit, request a non-urgent call back, or ask for a work or school excuse for 24 hours related to this e-Visit. If it has been greater than 24 hours you will need to follow up with your provider, or  enter a new e-Visit to address those concerns.  You will get an e-mail in the next two days asking about your experience.  I hope that your e-visit has been valuable and will speed your recovery. Thank you for using e-visits.  5-10 minutes spent reviewing and documenting in chart.

## 2019-10-15 ENCOUNTER — Telehealth: Payer: Self-pay | Admitting: Physician Assistant

## 2019-10-15 ENCOUNTER — Inpatient Hospital Stay: Admission: RE | Admit: 2019-10-15 | Payer: Self-pay | Source: Ambulatory Visit

## 2019-10-15 ENCOUNTER — Other Ambulatory Visit: Payer: Self-pay

## 2019-10-15 DIAGNOSIS — M109 Gout, unspecified: Secondary | ICD-10-CM

## 2019-10-15 MED ORDER — COLCHICINE 0.6 MG PO TABS: ORAL_TABLET | ORAL | 0 refills | Status: DC

## 2019-10-15 MED ORDER — INDOMETHACIN 50 MG PO CAPS
50.0000 mg | ORAL_CAPSULE | Freq: Three times a day (TID) | ORAL | 0 refills | Status: AC
Start: 2019-10-15 — End: 2019-10-22

## 2019-10-15 NOTE — Progress Notes (Signed)
Benjamin Meyer,   We are sorry that you are not feeling well. I am happy to help you today. I see you have been managing your immediate health problems with Urgent Care and E-visits, despite encouragement to find a Primary Care Provider.  Please make the time to find a PCP in order to better care for your health.  [facility] offers a free physician referral service available at [phone]. Our trained staff has the experience, knowledge and resources to put you in touch with a physician who is right for you.    Based on what you shared with me it looks like you have a flare of your gout.  Gout is a form of arthritis. It can cause pain and swelling in the joints. At first, it tends to affect only 1 joint - most frequently the big toe. It happens in people who have too much uric acid in the blood. Uric acid is a chemical that is produced when the body breaks down certain foods. Uric acid can form sharp needle-like crystals that build up in the joints and cause pain. Uric acid crystals can also form inside the tubes that carry urine from the kidneys to the bladder. These crystals can turn into "kidney stones" that can cause pain and problems with the flow of urine. People with gout get sudden "flares" or attacks of severe pain, most often the big toe, ankle, or knee. Often the joint also turns red and swells. Usually, only 1 joint is affected, but some people have pain in more than 1 joint. Gout flares tend to happen more often during the night.  The pain from gout can be extreme. The pain and swelling are worst at the beginning of a gout flare. The symptoms then get better within a few days to weeks. It is not clear how the body "turns off" a gout flare.  Do not start any NEW preventative medicine until the gout has cleared completely. However, If you are already on Probenecid or Allopurinol for CHRONIC gout, you may continue taking this during an active flare up  I have prescribed Indomethacin 50mg   three times daily for moderate to severe pain for no more than 7 days   HOME CARE Losing weight can help relieve gout. It's not clear that following a specific diet plan will help with gout symptoms but eating a balanced diet can help improve your overall health. It can also help you lose weight, if you are overweight. In general, a healthy diet includes plenty of fruits, vegetables, whole grains, and low-fat dairy products (labelled "low fat", skim, 2%). Avoid sugar sweetened drinks (including sodas, tea, juice and juice blends, coffee drinks and sports drinks) Limit alcohol to 1-2 drinks of beer, spirits or wine daily these can make gout flares worse. Some people with gout also have other health problems, such as heart disease, high blood pressure, kidney disease, or obesity. If you have any of these issues, it's important to work with your doctor to manage them. This can help improve your overall health and might also help with your gout.  GET HELP RIGHT AWAY IF: . Your symptoms persist after you have completed your treatment plan . You develop severe diarrhea . You develop abnormal sensations .  You develop vomiting,  .  You develop weakness .  You develop abdominal pain  FOLLOW UP WITH YOUR PRIMARY PROVIDER IF: . If your symptoms do not improve within 10 days  MAKE SURE YOU   Understand these  instructions.  Will watch your condition.  Will get help right away if you are not doing well or get worse.  Your e-visit answers were reviewed by a board certified advanced clinical practitioner to complete your personal care plan. Depending upon the condition, your plan could have included both over the counter or prescription medications.  Your safety is important to [place]. If you have drug allergies check your prescription carefully.   You can use MyChart to ask questions about today's visit, request a non-urgent call back, or ask for a work or school excuse for 24 hours related to this  e-Visit. If it has been greater than 24 hours you will need to follow up with your provider, or enter a new e-Visit to address those concerns. You will get an e-mail with a link to a survey asking about your experience.  We hope that your e-visit has been valuable and will speed your recovery! Thank you for using e-visits.   Greater than 5 minutes, yet less than 10 minutes of time have been spent researching, coordinating and implementing care for this patient today.

## 2019-10-15 NOTE — Addendum Note (Signed)
Addended by: Sebastian Ache on: 10/15/2019 02:43 PM   Modules accepted: Orders

## 2019-10-20 ENCOUNTER — Telehealth: Payer: Self-pay | Admitting: Nurse Practitioner

## 2019-10-20 DIAGNOSIS — B001 Herpesviral vesicular dermatitis: Secondary | ICD-10-CM

## 2019-10-20 MED ORDER — VALACYCLOVIR HCL 1 G PO TABS: ORAL_TABLET | ORAL | 0 refills | Status: DC

## 2019-10-20 NOTE — Progress Notes (Signed)
We are sorry that you are not feeling well.  Here is how we plan to help!  Based on what you have shared with me it does look like you have a viral infection.    Most cold sores or fever blisters are small fluid filled blisters around the mouth caused by herpes simplex virus.  The most common strain of the virus causing cold sores is herpes simplex virus 1.  It can be spread by skin contact, sharing eating utensils, or even sharing towels.  Cold sores are contagious to other people until dry. (Approximately 5-7 days).  Wash your hands. You can spread the virus to your eyes through handling your contact lenses after touching the lesions.  Most people experience pain at the sight or tingling sensations in their lips that may begin before the ulcers erupt.  Herpes simplex is treatable but not curable.  It may lie dormant for a long time and then reappear due to stress or prolonged sun exposure.  Many patients have success in treating their cold sores with an over the counter topical called Abreva.  You may apply the cream up to 5 times daily (maximum 10 days) until healing occurs.  If you would like to use an oral antiviral medication to speed the healing of your cold sore, I have sent a prescription to your local pharmacy Valacyclovir 2 gm take one by mouth twice a day for 1 day    HOME CARE:  Wash your hands frequently. Do not pick at or rub the sore. Don't open the blisters. Avoid kissing other people during this time. Avoid sharing drinking glasses, eating utensils, or razors. Do not handle contact lenses unless you have thoroughly washed your hands with soap and warm water! Avoid oral sex during this time.  Herpes from sores on your mouth can spread to your partner's genital area. Avoid contact with anyone who has eczema or a weakened immune system. Cold sores are often triggered by exposure to intense sunlight, use a lip balm containing a sunscreen (SPF 30 or higher).  GET HELP RIGHT AWAY  IF:  Blisters look infected. Blisters occur near or in the eye. Symptoms last longer than 10 days. Your symptoms become worse.  MAKE SURE YOU:  Understand these instructions. Will watch your condition. Will get help right away if you are not doing well or get worse.    Your e-visit answers were reviewed by a board certified advanced clinical practitioner to complete your personal care plan.  Depending upon the condition, your plan could have  Included both over the counter or prescription medications.    Please review your pharmacy choice.  Be sure that the pharmacy you have chosen is open so that you can pick up your prescription now.  If there is a problem you can message your provider in MyChart to have the prescription routed to another pharmacy.    Your safety is important to us.  If you have drug allergies check our prescription carefully.  For the next 24 hours you can use MyChart to ask questions about today's visit, request a non-urgent call back, or ask for a work or school excuse from your e-visit provider.  You will get an email in the next two days asking about your experience.  I hope that your e-visit has been valuable and will speed your recovery.  5-10 minutes spent reviewing and documenting in chart.   

## 2019-12-08 ENCOUNTER — Telehealth: Payer: Self-pay | Admitting: Physician Assistant

## 2019-12-08 DIAGNOSIS — B356 Tinea cruris: Secondary | ICD-10-CM

## 2019-12-08 MED ORDER — TERBINAFINE HCL 1 % EX CREA: 1.0000 "application " | TOPICAL_CREAM | Freq: Two times a day (BID) | CUTANEOUS | 0 refills | Status: DC

## 2019-12-08 MED ORDER — FLUCONAZOLE 150 MG PO TABS: 150.0000 mg | ORAL_TABLET | ORAL | 0 refills | Status: DC

## 2019-12-08 NOTE — Progress Notes (Signed)
Hi Benjamin Meyer,  I am sorry you are not feeling well. I am happy to help you today - please read below for your treatment plan.   I do not see any visits with your PCP recently in your chart. Jock itch can be caused by uncontrolled blood sugars.  Please schedule an appointment with your Primary Care Provider to have some basic lab work done to screen for Diabetes.   If you do not have a PCP, [facility] offers a free physician referral service available at [phone]. Our trained staff has the experience, knowledge and resources to put you in touch with a physician who is right for you.   You also have the option of a video visit through [email or web address].[facility].com E-Visit for [employer]  We are sorry that you are not feeling well. Here is how we plan to help!  Based on what you shared with me it looks like you have tinea cruris, or "Jock Itch".  The symptoms of Jock Itch include red, peeling, itchy rash that affects the groin (crease where the leg meets the trunk).  This fungal infection can be spread through shared towels, clothing, bedding, or hard surfaces (particularly in moist areas) such as shower stalls, locker room floors, or pool area that has the fungus present. If you have a fungal infection on one part of your body, you can also spread it to other parts. For instance, men with a fungal infection on their feet sometimes spread it to their groin.  I am recommending:Terbinafine 1% cream or gel, apply to area once or twice per day   Prescription medications are only indicated for an extensive rash or if over the counter treatments have failed.  I am prescribing:Fluconazole 150 mg once weekly for two to four weeks  HOME CARE:  . Keep affected area clean, dry, and cool. Wendee Copp with soap and shampoo after sports or exercise and dry yourself well after bathing or swimming . Wear cotton underwear and change them if they become damp or sweaty. . Avoid using swimming pools,  public showers, or baths.  GET HELP RIGHT AWAY IF:  . Symptoms that don't away after treatment. . Severe itching that persists. . If your rash spreads or swells. . If your rash begins to have drainage or smell. . You develop a fever.  MAKE SURE YOU    Understand these instructions.  Will watch your condition.  Will get help right away if you are not doing well or get worse.  Thank you for choosing an e-visit.  Your e-visit answers were reviewed by a board certified advanced clinical practitioner to complete your personal care plan. Depending upon the condition, your plan could have included both over the counter or prescription medications.  Please review your pharmacy choice. Make sure the pharmacy is open so you can pick up prescription now. If there is a problem, you may contact your provider through [employer] and have the prescription routed to another pharmacy.  Your safety is important to [place]. If you have drug allergies check your prescription carefully.   For the next 24 hours you can use MyChart to ask questions about today's visit, request a non-urgent call back, or ask for a work or school excuse.  You will get an email in the next two days asking about your experience. I hope that your e-visit has been valuable and will speed your recovery   References or for more information:  [employer]hu https://hebert-johnson.[email or web address] [employer]er.de?search=jock%20itch&source=search_result&selectedTitle=3~52&usage_type=default&display_rank=3  Greater than 5  minutes, yet less than 10 minutes of time have been spent researching, coordinating and implementing care for this patient today.

## 2019-12-14 ENCOUNTER — Other Ambulatory Visit (INDEPENDENT_AMBULATORY_CARE_PROVIDER_SITE_OTHER): Payer: Self-pay | Admitting: Nurse Practitioner

## 2019-12-15 ENCOUNTER — Telehealth: Payer: Self-pay | Admitting: Physician Assistant

## 2019-12-15 DIAGNOSIS — M545 Low back pain, unspecified: Secondary | ICD-10-CM

## 2019-12-15 MED ORDER — IBUPROFEN 800 MG PO TABS: 800.0000 mg | ORAL_TABLET | Freq: Three times a day (TID) | ORAL | 0 refills | Status: AC | PRN

## 2019-12-15 NOTE — Progress Notes (Signed)
Benjamin Meyer,  We are sorry that you are not feeling well.  I see you have been seen with this problem a few months ago as well as a few times last year.  Please consider following-up with your PCP soon to discuss your back pain.  Imaging may be needed.  If you do not have a PCP, [facility] offers a free physician referral service available at [phone]. Our trained staff has the experience, knowledge and resources to put you in touch with a physician who is right for you.    Based on what you have shared with me it looks like you mostly have acute back pain related to possible sciatica.   I have prescribed Ibuprofen 800 mg non-steroid anti-inflammatory (NSAID). Some patients experience stomach irritation or in increased heartburn with anti-inflammatory drugs.  The pain often gets better over time.  The cause of back pain is usually not dangerous.  Most people can learn to manage their back pain on their own.  Home Care  Stay active.  Start with short walks on flat ground if you can.  Try to walk farther each day.  Do not sit, drive or stand in one place for more than 30 minutes.  Do not stay in bed.  Do not avoid exercise or work.  Activity can help your back heal faster.  Be careful when you bend or lift an object.  Bend at your knees, keep the object close to you, and do not twist.  Sleep on a firm mattress.  Lie on your side, and bend your knees.  If you lie on your back, put a pillow under your knees.  Only take medicines as told by your doctor.  Put ice on the injured area.  Put ice in a plastic bag  Place a towel between your skin and the bag  Leave the ice on for 15-20 minutes, 3-4 times a day for the first 2-3 days. 210 After that, you can switch between ice and heat packs.  Ask your doctor about back exercises or massage.  Avoid feeling anxious or stressed.  Find good ways to deal with stress, such as exercise.  Get Help Right Way If:  Your pain does not go away  with rest or medicine.  Your pain does not go away in 1 week.  You have new problems.  You do not feel well.  The pain spreads into your legs.  You cannot control when you poop (bowel movement) or pee (urinate)  You feel sick to your stomach (nauseous) or throw up (vomit)  You have belly (abdominal) pain.  You feel like you may pass out (faint).  If you develop a fever.  Make Sure you:  Understand these instructions.  Will watch your condition  Will get help right away if you are not doing well or get worse.  Your e-visit answers were reviewed by a board certified advanced clinical practitioner to complete your personal care plan.  Depending on the condition, your plan could have included both over the counter or prescription medications.  If there is a problem please reply  once you have received a response from your provider.  Your safety is important to [place].  If you have drug allergies check your prescription carefully.    You can use MyChart to ask questions about today's visit, request a non-urgent call back, or ask for a work or school excuse for 24 hours related to this e-Visit. If it has been greater than  24 hours you will need to follow up with your provider, or enter a new e-Visit to address those concerns.  You will get an e-mail in the next two days asking about your experience.  I hope that your e-visit has been valuable and will speed your recovery. Thank you for using e-visits.  Greater than 5 minutes, yet less than 10 minutes of time have been spent researching, coordinating and implementing care for this patient today.

## 2020-01-12 ENCOUNTER — Telehealth: Payer: Self-pay | Admitting: Nurse Practitioner

## 2020-01-12 DIAGNOSIS — M545 Low back pain, unspecified: Secondary | ICD-10-CM

## 2020-01-12 MED ORDER — PREDNISONE 10 MG (21) PO TBPK: ORAL_TABLET | ORAL | 0 refills | Status: DC

## 2020-01-12 NOTE — Progress Notes (Signed)
We are sorry that you are not feeling well.  Here is how we plan to help!  Based on what you have shared with me it looks like you mostly have acute back pain.  Acute back pain is defined as musculoskeletal pain that can resolve in 1-3 weeks with conservative treatment. You should not take ibuprofen while you are on a steroid.  I have prescribed steroid dose pack. Directions for 6 day taper: Day 1: 2 tablets before breakfast, 1 after both lunch & dinner and 2 at bedtime Day 2: 1 tab before breakfast, 1 after both lunch & dinner and 2 at bedtime Day 3: 1 tab at each meal & 1 at bedtime Day 4: 1 tab at breakfast, 1 at lunch, 1 at bedtime Day 5: 1 tab at breakfast & 1 tab at bedtime Day 6: 1 tab at breakfast     Some patients experience stomach irritation or in increased heartburn with anti-inflammatory drugs.  Please keep in mind that muscle relaxer's can cause fatigue and should not be taken while at work or driving.  Back pain is very common.  The pain often gets better over time.  The cause of back pain is usually not dangerous.  Most people can learn to manage their back pain on their own.  Home Care  Stay active.  Start with short walks on flat ground if you can.  Try to walk farther each day.  Do not sit, drive or stand in one place for more than 30 minutes.  Do not stay in bed.  Do not avoid exercise or work.  Activity can help your back heal faster.  Be careful when you bend or lift an object.  Bend at your knees, keep the object close to you, and do not twist.  Sleep on a firm mattress.  Lie on your side, and bend your knees.  If you lie on your back, put a pillow under your knees.  Only take medicines as told by your doctor.  Put ice on the injured area.  Put ice in a plastic bag  Place a towel between your skin and the bag  Leave the ice on for 15-20 minutes, 3-4 times a day for the first 2-3 days. 210 After that, you can switch between ice and heat packs.  Ask your  doctor about back exercises or massage.  Avoid feeling anxious or stressed.  Find good ways to deal with stress, such as exercise.  Get Help Right Way If:  Your pain does not go away with rest or medicine.  Your pain does not go away in 1 week.  You have new problems.  You do not feel well.  The pain spreads into your legs.  You cannot control when you poop (bowel movement) or pee (urinate)  You feel sick to your stomach (nauseous) or throw up (vomit)  You have belly (abdominal) pain.  You feel like you may pass out (faint).  If you develop a fever.  Make Sure you:  Understand these instructions.  Will watch your condition  Will get help right away if you are not doing well or get worse.  Your e-visit answers were reviewed by a board certified advanced clinical practitioner to complete your personal care plan.  Depending on the condition, your plan could have included both over the counter or prescription medications.  If there is a problem please reply  once you have received a response from your provider.  Your safety is important  to [place].  If you have drug allergies check your prescription carefully.    You can use MyChart to ask questions about today's visit, request a non-urgent call back, or ask for a work or school excuse for 24 hours related to this e-Visit. If it has been greater than 24 hours you will need to follow up with your provider, or enter a new e-Visit to address those concerns.  You will get an e-mail in the next two days asking about your experience.  I hope that your e-visit has been valuable and will speed your recovery. Thank you for using e-visits.  5-10 minutes spent reviewing and documenting in chart.

## 2020-04-16 ENCOUNTER — Telehealth: Payer: Self-pay | Admitting: Nurse Practitioner

## 2020-04-16 DIAGNOSIS — M545 Low back pain, unspecified: Secondary | ICD-10-CM

## 2020-04-16 NOTE — Progress Notes (Signed)
Based on what you shared with me it looks like you have back pain that has been treated in several evisits,that should be evaluated in a face to face office visit. According to your chart this  is a reoccurring issues that needs to be treated ina face to face visit.    NOTE: If you entered your credit card information for this eVisit, you will not be charged. You may see a "hold" on your card for the $35 but that hold will drop off and you will not have a charge processed.  If you are having a true medical emergency please call 911.     For an urgent face to face visit, [facility] has four urgent care centers for your convenience:   . [facility]    [phone]                  Get Driving Directions  [address] [place], [place] [address] . 10 am to 8 pm Monday-Friday . 12 pm to 8 pm Saturday-Sunday   . [facility] at [facility]  [phone]                  Get Driving Directions  [address], [address] [place], [place] [address] . 8 am to 8 pm Monday-Friday . 9 am to 6 pm Saturday . 11 am to 6 pm Sunday   . [facility] at [facility]  [phone]                  Get Driving Directions   [address] [place], [place] [address] . 8 am to 8 pm Monday-Friday . 8 am to 4 pm Saturday-Sunday    . [facility] at [facility] Directions  [phone]  [address], [address] [place], [place] [address]  . Monday-Friday, 12 PM to 6 PM    Your e-visit answers were reviewed by a board certified advanced clinical practitioner to complete your personal care plan.  Thank you for using e-Visits.

## 2020-04-21 ENCOUNTER — Ambulatory Visit (HOSPITAL_COMMUNITY)
Admission: EM | Admit: 2020-04-21 | Discharge: 2020-04-21 | Discharge status: Against Medical Advice | Payer: Self-pay | Attending: Family Medicine | Admitting: Family Medicine

## 2020-04-21 ENCOUNTER — Encounter (HOSPITAL_COMMUNITY): Payer: Self-pay | Admitting: *Deleted

## 2020-04-21 ENCOUNTER — Other Ambulatory Visit: Payer: Self-pay

## 2020-04-21 DIAGNOSIS — R42 Dizziness and giddiness: Secondary | ICD-10-CM

## 2020-04-21 DIAGNOSIS — I10 Essential (primary) hypertension: Secondary | ICD-10-CM

## 2020-04-21 NOTE — ED Notes (Signed)
Pt left before testing completed and discharged: pt stated he would come back tomorrow.

## 2020-04-21 NOTE — ED Provider Notes (Signed)
[facility]    CSN: [id number] Arrival date & time: 04/21/20  1026      History   Chief Complaint Chief Complaint  Patient presents with  . Dizziness    HPI Benjamin Meyer is a 44 y.o. male with past medical history of poorly controlled hypertension who presents to the clinic today with a acute bout of dizziness.  Patient stated that it started this morning and waxed and wane for about 30 minutes then completely dissipated.  No aggravating factors that the patient can think of.  Patient stated that "something just did not feel right."  Denied feeling of the room spinning, hearing loss, chest pain, difficulty breathing, upper or lower extremity weakness.  Patient's high blood pressure has been relatively uncontrolled due to lack of PCP and correct medication discipline.  BP was 162/83 in clinic.  Patient states that he is trying to try to be better about his health and has began taking his blood pressure medications again about a month ago.  He is requesting refills in clinic.  HPI  Past Medical History:  Diagnosis Date  . Gout   . Hypertension     There are no problems to display for this patient.   History reviewed. No pertinent surgical history.     Home Medications    Prior to Admission medications   Medication Sig Start Date End Date Taking? Authorizing Provider  amLODipine (NORVASC) 10 MG tablet Take 1 tablet (10 mg total) by mouth daily. 01/18/19  Yes Eustace Moore, MD  ibuprofen (ADVIL) 800 MG tablet Take 1 tablet (800 mg total) by mouth every 8 (eight) hours as needed for moderate pain. 08/11/19  Yes Martin, Mary-Margaret, FNP  lisinopril-hydrochlorothiazide (ZESTORETIC) 20-12.5 MG tablet Take 1 tablet by mouth daily. 01/18/19  Yes Eustace Moore, MD  colchicine 0.6 MG tablet Take 2 tabs immediately, then 1 tab twice per day for the duration of the flare up to a max of 7 days 10/15/19 04/21/20  McVey, Madelaine Bhat, PA-C    Family History Family  History  Problem Relation Age of Onset  . Hypertension Mother   . Diabetes Mother   . Heart failure Father   . Hypertension Father     Social History Social History   Tobacco Use  . Smoking status: Never Smoker  . Smokeless tobacco: Never Used  Vaping Use  . Vaping Use: Never used  Substance Use Topics  . Alcohol use: Yes    Comment: occasionally  . Drug use: No     Allergies   Patient has no known allergies.   Review of Systems Review of Systems See HPI  Physical Exam Triage Vital Signs ED Triage Vitals  Enc Vitals Group     BP 04/21/20 1035 (!) 162/83     Pulse Rate 04/21/20 1035 99     Resp 04/21/20 1035 (!) 28     Temp 04/21/20 1035 97.8 F (36.6 C)     Temp Source 04/21/20 1035 Oral     SpO2 04/21/20 1035 97 %     Weight --      Height --      Head Circumference --      Peak Flow --      Pain Score 04/21/20 1038 0     Pain Loc --      Pain Edu? --      Excl. in GC? --    No data found.  Updated Vital Signs BP (!) 162/83 (  BP Location: Right Arm)   Pulse 99   Temp 97.8 F (36.6 C) (Oral)   Resp (!) 28   SpO2 97%      Physical Exam Constitutional:      General: He is not in acute distress.    Appearance: He is well-developed. He is obese.  HENT:     Head: Normocephalic and atraumatic.     Right Ear: Tympanic membrane and ear canal normal. There is no impacted cerumen.     Left Ear: Tympanic membrane and ear canal normal. There is no impacted cerumen.     Nose: Nose normal.     Mouth/Throat:     Mouth: Mucous membranes are moist.     Pharynx: Oropharynx is clear.  Eyes:     Conjunctiva/sclera: Conjunctivae normal.     Pupils: Pupils are equal, round, and reactive to light.     Comments: Patient had minimal horizontal bilateral nystagmus noted upon EOM testing  Cardiovascular:     Rate and Rhythm: Normal rate.     Comments: Patient's heart difficult to fully appreciate due to body habitus Pulmonary:     Effort: Pulmonary effort is  normal. No respiratory distress.     Comments: Lung sounds difficult to appreciate fully due to body habitus Abdominal:     General: There is no distension.     Palpations: Abdomen is soft.  Musculoskeletal:        General: Tenderness present. Normal range of motion.     Cervical back: Normal range of motion.     Comments: Patient had notable tenderness to palpation along lower lumbar musculature on the left side.  Patient states that he is referring jolts of pain that shoot down his left leg to his foot.  Skin:    General: Skin is warm and dry.  Neurological:     General: No focal deficit present.     Mental Status: He is alert and oriented to person, place, and time.     Motor: No weakness.     Coordination: Coordination normal.     Gait: Gait normal.     Deep Tendon Reflexes: Reflexes normal.  Psychiatric:        Behavior: Behavior normal.      UC Treatments / Results  Labs (all labs ordered are listed, but only abnormal results are displayed) Labs Reviewed - No data to display  EKG   Radiology No results found.  Procedures Procedures (including critical care time)  Medications Ordered in UC Medications - No data to display  Initial Impression / Assessment and Plan / UC Course  I have reviewed the triage vital signs and the nursing notes.  Pertinent labs & imaging results that were available during my care of the patient were reviewed by me and considered in my medical decision making (see chart for details).   Patient's neurological exam was unremarkable.  No focal deficits noted. CBC, CMP ordered.  Chest x-ray ordered.  Orthostatic vitals ordered.  EKG ordered. Patient left the clinic before blood work, chest x-ray, orthostatic vitals, EKG were obtained.  Patient stated that he needed to go to work and would return tomorrow for above test and to get his hypertension medications refilled.  Patient given strict return precautions.  Pending imaging, blood work,  EKG, vitals..  Acute vertigo suspected due to focally intact neurological exam and presentation of symptoms that have since resolved.  Low suspicion for stroke, heart attack.   Final Clinical Impressions(s) / UC Diagnoses  Final diagnoses:  Dizziness  Primary hypertension     Discharge Instructions     To return tomorrow    ED Prescriptions    None     PDMP not reviewed this encounter.   Eustace Moore, MD 04/21/20 [id number]

## 2020-04-21 NOTE — Discharge Instructions (Signed)
To return tomorrow

## 2020-04-21 NOTE — ED Triage Notes (Signed)
Patient states he was driving and was feeling really lightheaded today. Patient states something does not feel right. BP meds taken an hour prior to arrival. Patient denies chest pain or SOB.

## 2020-04-25 ENCOUNTER — Encounter (HOSPITAL_COMMUNITY): Payer: Self-pay | Admitting: Emergency Medicine

## 2020-04-25 ENCOUNTER — Emergency Department (HOSPITAL_COMMUNITY)
Admission: EM | Admit: 2020-04-25 | Discharge: 2020-04-25 | Disposition: A | Discharge status: Home / Self Care | Payer: Self-pay | Attending: Emergency Medicine | Admitting: Emergency Medicine

## 2020-04-25 ENCOUNTER — Other Ambulatory Visit: Payer: Self-pay

## 2020-04-25 ENCOUNTER — Emergency Department (HOSPITAL_COMMUNITY): Payer: Self-pay

## 2020-04-25 DIAGNOSIS — R0789 Other chest pain: Secondary | ICD-10-CM | POA: Insufficient documentation

## 2020-04-25 DIAGNOSIS — I10 Essential (primary) hypertension: Secondary | ICD-10-CM | POA: Insufficient documentation

## 2020-04-25 DIAGNOSIS — Z79899 Other long term (current) drug therapy: Secondary | ICD-10-CM | POA: Insufficient documentation

## 2020-04-25 DIAGNOSIS — R079 Chest pain, unspecified: Secondary | ICD-10-CM

## 2020-04-25 LAB — CBC
HCT: 49.5 % (ref 39.0–52.0)
Hemoglobin: 16.4 g/dL (ref 13.0–17.0)
MCH: 29.5 pg (ref 26.0–34.0)
MCHC: 33.1 g/dL (ref 30.0–36.0)
MCV: 89.2 fL (ref 80.0–100.0)
Platelets: 268 10*3/uL (ref 150–400)
RBC: 5.55 MIL/uL (ref 4.22–5.81)
RDW: 13.9 % (ref 11.5–15.5)
WBC: 9.6 10*3/uL (ref 4.0–10.5)
nRBC: 0 % (ref 0.0–0.2)

## 2020-04-25 LAB — BASIC METABOLIC PANEL
Anion gap: 9 (ref 5–15)
BUN: 14 mg/dL (ref 6–20)
CO2: 26 mmol/L (ref 22–32)
Calcium: 8.9 mg/dL (ref 8.9–10.3)
Chloride: 102 mmol/L (ref 98–111)
Creatinine, Ser: 1.37 mg/dL — ABNORMAL HIGH (ref 0.61–1.24)
GFR, Estimated: >60 mL/min (ref >60)
Glucose, Bld: 101 mg/dL — ABNORMAL HIGH (ref 70–99)
Potassium: 4.1 mmol/L (ref 3.5–5.1)
Sodium: 137 mmol/L (ref 135–145)

## 2020-04-25 LAB — TROPONIN I (HIGH SENSITIVITY)
Troponin I (High Sensitivity): 6 ng/L (ref <18)
Troponin I (High Sensitivity): 6 ng/L (ref <18)

## 2020-04-25 MED ORDER — ALUM & MAG HYDROXIDE-SIMETH 200-200-20 MG/5ML PO SUSP
30.0000 mL | Freq: Once | ORAL | Status: AC
  Administered 2020-04-25: 30 mL via ORAL
  Filled 2020-04-25: qty 30

## 2020-04-25 MED ORDER — LISINOPRIL-HYDROCHLOROTHIAZIDE 20-12.5 MG PO TABS: 1.0000 | ORAL_TABLET | Freq: Every day | ORAL | 1 refills | Status: DC

## 2020-04-25 MED ORDER — METHOCARBAMOL 500 MG PO TABS: 500.0000 mg | ORAL_TABLET | Freq: Two times a day (BID) | ORAL | 0 refills | Status: DC

## 2020-04-25 MED ORDER — LIDOCAINE VISCOUS HCL 2 % MT SOLN
15.0000 mL | Freq: Once | OROMUCOSAL | Status: AC
  Administered 2020-04-25: 15 mL via ORAL
  Filled 2020-04-25: qty 15

## 2020-04-25 MED ORDER — METHOCARBAMOL 500 MG PO TABS
750.0000 mg | ORAL_TABLET | Freq: Once | ORAL | Status: AC
  Administered 2020-04-25: 750 mg via ORAL
  Filled 2020-04-25: qty 2

## 2020-04-25 MED ORDER — HYDROCHLOROTHIAZIDE 12.5 MG PO CAPS
12.5000 mg | ORAL_CAPSULE | Freq: Once | ORAL | Status: AC
  Administered 2020-04-25: 12.5 mg via ORAL
  Filled 2020-04-25: qty 1

## 2020-04-25 NOTE — ED Provider Notes (Signed)
[facility]-EMERGENCY DEPT Provider Note   CSN: [id number] Arrival date & time: 04/25/20  1934     History Chief Complaint  Patient presents with  . Chest Pain    Benjamin Meyer is a 44 y.o. male.  HPI Patient is 44 year old male with past medical history of hypertension and gout.  He is on hydrochlorothiazide and amlodipine however he states he only takes the amlodipine and has been out of hydrochlorothiazide for several months.  He states he is not currently have a primary care doctor.  He states that he is motivated to take care of his health but has not been doing so well recently.  He is morbidly obese.  That he began having what he describes as a strange discomfort in his left chest that started around 9 AM this morning.  He states it was right after he ate and he felt like it could be reflux.  He states it is 4/10 at its worst and he states that it seems to come and go with no significant aggravating or mitigating factors.  He has not taken anything for his pain yet.  He states it is not exertional nor pleuritic.  He states it is more of ache than a pressure or sharp stabbing pain.  He has no history of blood clots.  No recent surgeries or immobilization no recent travel.  He states he does not feel short of breath at all and denies any nausea, vomiting, diaphoresis lightheadedness or dizziness.  He is not a smoker.  His father has heart failure and was told that he may have had a heart attack at some point but he has no known heart attacks in the family with first-degree relatives at a young age.    HPI: A 43 year old patient with a history of hypertension and obesity presents for evaluation of chest pain. Initial onset of pain was more than 6 hours ago. The patient's chest pain is sharp and is not worse with exertion. The patient's chest pain is middle- or left-sided, is not well-localized, is not described as heaviness/pressure/tightness and does not radiate  to the arms/jaw/neck. The patient does not complain of nausea and denies diaphoresis. The patient has no history of stroke, has no history of peripheral artery disease, has not smoked in the past 90 days, denies any history of treated diabetes, has no relevant family history of coronary artery disease (first degree relative at less than age 61) and has no history of hypercholesterolemia.   Past Medical History:  Diagnosis Date  . Gout   . Hypertension     There are no problems to display for this patient.   History reviewed. No pertinent surgical history.     Family History  Problem Relation Age of Onset  . Hypertension Mother   . Diabetes Mother   . Heart failure Father   . Hypertension Father     Social History   Tobacco Use  . Smoking status: Never Smoker  . Smokeless tobacco: Never Used  Vaping Use  . Vaping Use: Never used  Substance Use Topics  . Alcohol use: Yes    Comment: occasionally  . Drug use: No    Home Medications Prior to Admission medications   Medication Sig Start Date End Date Taking? Authorizing Provider  amLODipine (NORVASC) 10 MG tablet Take 1 tablet (10 mg total) by mouth daily. 01/18/19   Eustace Moore, MD  ibuprofen (ADVIL) 800 MG tablet Take 1 tablet (800 mg total) by  mouth every 8 (eight) hours as needed for moderate pain. 08/11/19   Daphine DeutscherMartin, Mary-Margaret, FNP  lisinopril-hydrochlorothiazide (ZESTORETIC) 20-12.5 MG tablet Take 1 tablet by mouth daily. 04/25/20   Gailen ShelterFondaw, Elmar Antigua S, PA  methocarbamol (ROBAXIN) 500 MG tablet Take 1 tablet (500 mg total) by mouth 2 (two) times daily. 04/25/20   Gailen ShelterFondaw, Lorenna Lurry S, PA  colchicine 0.6 MG tablet Take 2 tabs immediately, then 1 tab twice per day for the duration of the flare up to a max of 7 days 10/15/19 04/21/20  McVey, Madelaine BhatElizabeth Whitney, PA-C    Allergies    Patient has no known allergies.  Review of Systems   Review of Systems  Constitutional: Positive for fatigue. Negative for chills and fever.    HENT: Negative for congestion.   Eyes: Negative for pain.  Respiratory: Negative for cough and shortness of breath.   Cardiovascular: Positive for chest pain. Negative for leg swelling.  Gastrointestinal: Negative for abdominal pain and vomiting.  Genitourinary: Negative for dysuria.  Musculoskeletal: Positive for myalgias.  Skin: Negative for rash.  Neurological: Negative for dizziness and headaches.    Physical Exam Updated Vital Signs BP (!) 156/94   Pulse 86   Temp 97.7 F (36.5 C) (Oral)   Resp 18   Ht 5\' 11"  (1.803 m)   Wt (!) 178.7 kg   SpO2 95%   BMI 54.95 kg/m   Physical Exam Vitals and nursing note reviewed.  Constitutional:      General: He is not in acute distress.    Appearance: He is obese.     Comments: Pleasant 44 year old male morbidly obese.  Able answer questions appropriately follow commands.  HENT:     Head: Normocephalic and atraumatic.     Nose: Nose normal.     Mouth/Throat:     Mouth: Mucous membranes are moist.  Eyes:     General: No scleral icterus. Cardiovascular:     Rate and Rhythm: Normal rate and regular rhythm.     Pulses: Normal pulses.     Heart sounds: Normal heart sounds.     Comments: Bilateral radial pulses are 3+ and symmetric. Heart sounds NML.  No murmur. Pulmonary:     Effort: Pulmonary effort is normal. No respiratory distress.     Breath sounds: No wheezing.  Abdominal:     Palpations: Abdomen is soft.     Tenderness: There is no abdominal tenderness. There is no right CVA tenderness, left CVA tenderness, guarding or rebound.  Musculoskeletal:     Cervical back: Normal range of motion.     Right lower leg: No edema.     Left lower leg: No edema.     Comments: Reproducible muscular tenderness to palpation of the left trapezius muscle.  There is also sounds pectoral tenderness palpation that incompletely reproduces patient's chest pain.  Skin:    General: Skin is warm and dry.     Capillary Refill: Capillary refill  takes less than 2 seconds.  Neurological:     Mental Status: He is alert. Mental status is at baseline.  Psychiatric:        Mood and Affect: Mood normal.        Behavior: Behavior normal.     ED Results / Procedures / Treatments   Labs (all labs ordered are listed, but only abnormal results are displayed) Labs Reviewed  BASIC METABOLIC PANEL - Abnormal; Notable for the following components:      Result Value   Glucose, Bld 101 (*)  Creatinine, Ser 1.37 (*)    All other components within normal limits  CBC  TROPONIN I (HIGH SENSITIVITY)  TROPONIN I (HIGH SENSITIVITY)    EKG EKG Interpretation  Date/Time:  Monday April 25 2020 19:42:16 EDT Ventricular Rate:  98 PR Interval:    QRS Duration: 104 QT Interval:  370 QTC Calculation: 473 R Axis:   -3 Text Interpretation: Sinus rhythm Consider left atrial enlargement 12 Lead; Mason-Likar Confirmed by Benjiman Core [phone]) on 04/25/2020 11:01:13 PM   Radiology DG Chest 2 View  Result Date: 04/25/2020 CLINICAL DATA:  44 year old male with chest pain. EXAM: CHEST - 2 VIEW COMPARISON:  None. FINDINGS: The lungs are clear. There is no pleural effusion pneumothorax. Top-normal cardiac size. No acute osseous pathology. IMPRESSION: No active cardiopulmonary disease. Electronically Signed   By: Elgie Collard M.D.   On: 04/25/2020 20:00    Procedures Procedures (including critical care time)  Medications Ordered in ED Medications  methocarbamol (ROBAXIN) tablet 750 mg (750 mg Oral Given 04/25/20 2145)  alum & mag hydroxide-simeth (MAALOX/MYLANTA) 200-200-20 MG/5ML suspension 30 mL (30 mLs Oral Given 04/25/20 2145)    And  lidocaine (XYLOCAINE) 2 % viscous mouth solution 15 mL (15 mLs Oral Given 04/25/20 2145)  hydrochlorothiazide (MICROZIDE) capsule 12.5 mg (12.5 mg Oral Given 04/25/20 2145)    ED Course  I have reviewed the triage vital signs and the nursing notes.  Pertinent labs & imaging results that were  available during my care of the patient were reviewed by me and considered in my medical decision making (see chart for details).  Patient is a 44 year old male with past medical history of significant obesity and hypertension treated with amlodipine and HCTZ he has been off his HCTZ for some time.  He presented today for chest pain that is very atypical nonexertional nonpleuritic in his left shoulder area he has reproducible muscular tenderness palpation of the left trapezius and possible/incomplete reproducible tenderness to palpation of the left anterior chest/pectoral muscle on the left side.  Physical exam is otherwise unremarkable.    Low suspicion for pulmonary embolism patient has no history of VTE and no inciting factors no history of cancer.  No recent immobilization or surgeries or fractures. Low suspicion for ACS however will have patient follow-up with cardiology.  Patient has reassuringly normal troponin x2 and chest pain work-up.  Clinical Course as of Apr 27 35  Mon Apr 25, 2020  2141 Chest x-ray without any acute abnormality.  Agree with radiology read.   [WF]  2142 EKG reviewed by myself and Dr. Rubin Payor.  No acute evidence of ischemia.   [WF]  2142 BMP without significant abnormality.  Mild elevation in creatinine may be chronic no comparison readily available to me.  Basic metabolic panel(!) [WF]  2143 Troponin x1 within normal limits  Troponin I (High Sensitivity) [WF]  2143 CBC without leukocytosis or anemia.  CBC [WF]    Clinical Course User Index [WF] Gailen Shelter, [place]   MDM Rules/Calculators/A&P HEAR Score: 2                         The emergent causes of chest pain include: Acute coronary syndrome, tamponade, pericarditis/myocarditis, aortic dissection, pulmonary embolism, tension pneumothorax, pneumonia, and esophageal rupture.  I do not believe the patient has an emergent cause of chest pain, other urgent/non-acute considerations include, but are not  limited to: chronic angina, aortic stenosis, cardiomyopathy, mitral valve prolapse, pulmonary hypertension, aortic insufficiency, right  ventricular hypertrophy, pleuritis, bronchitis, pneumothorax, tumor, gastroesophageal reflux disease (GERD), esophageal spasm, Mallory-Weiss syndrome, peptic ulcer disease, pancreatitis, functional gastrointestinal pain, cervical or thoracic disk disease or arthritis, shoulder arthritis, costochondritis, subacromial bursitis, anxiety or panic attack, herpes zoster, breast disorders, chest wall tumors, thoracic outlet syndrome, mediastinitis.   Patient given strict return precautions.  We will discharge patient with his Zestoretic and Robaxin in case this is musculoskeletal.  Given strict return precautions and will follow up with cardiology and PCP.  Final Clinical Impression(s) / ED Diagnoses Final diagnoses:  Chest pain, unspecified type    Rx / DC Orders ED Discharge Orders         Ordered    methocarbamol (ROBAXIN) 500 MG tablet  2 times daily        04/25/20 2302    lisinopril-hydrochlorothiazide (ZESTORETIC) 20-12.5 MG tablet  Daily        04/25/20 2308           Solon Augusta Keaau, [place] 04/26/20 0038    Benjiman Core, MD 04/27/20 0006

## 2020-04-25 NOTE — ED Triage Notes (Signed)
Patient is complaining of left chest pain that started at 0900. Patient states pain is 4/10. No other symptoms.

## 2020-04-25 NOTE — ED Notes (Signed)
Save blue and dark green in main lab ?

## 2020-04-25 NOTE — Discharge Instructions (Addendum)
Your EKG, chest x-ray, blood work were all reassuring today.  I given you follow-up with our primary care clinic at [facility] and wellness clinic.  I have also given you the phone number for the cardiologist group here with [facility].  Please follow-up with both of these.  I have also given you a prescription for Robaxin which should help with the muscle pain in your back and may also help with your chest pain if it is muscular in origin.

## 2020-05-11 ENCOUNTER — Telehealth: Payer: Self-pay | Admitting: Nurse Practitioner

## 2020-05-11 DIAGNOSIS — M109 Gout, unspecified: Secondary | ICD-10-CM

## 2020-05-11 MED ORDER — INDOMETHACIN 50 MG PO CAPS: 50.0000 mg | ORAL_CAPSULE | Freq: Three times a day (TID) | ORAL | 0 refills | Status: DC

## 2020-05-11 MED ORDER — PREDNISONE 20 MG PO TABS: ORAL_TABLET | ORAL | 0 refills | Status: DC

## 2020-05-11 NOTE — Progress Notes (Signed)
E-Visit for Gout  We are sorry that you are not feeling well. We are here to help!  Based on what you shared with me it looks like you have a flare of your gout.  Gout is a form of arthritis. It can cause pain and swelling in the joints. At first, it tends to affect only 1 joint - most frequently the big toe. It happens in people who have too much uric acid in the blood. Uric acid is a chemical that is produced when the body breaks down certain foods. Uric acid can form sharp needle-like crystals that build up in the joints and cause pain. Uric acid crystals can also form inside the tubes that carry urine from the kidneys to the bladder. These crystals can turn into "kidney stones" that can cause pain and problems with the flow of urine. People with gout get sudden "flares" or attacks of severe pain, most often the big toe, ankle, or knee. Often the joint also turns red and swells. Usually, only 1 joint is affected, but some people have pain in more than 1 joint. Gout flares tend to happen more often during the night.  The pain from gout can be extreme. The pain and swelling are worst at the beginning of a gout flare. The symptoms then get better within a few days to weeks. It is not clear how the body "turns off" a gout flare.  Do not start any NEW preventative medicine until the gout has cleared completely. However, If you are already on Probenecid or Allopurinol for CHRONIC gout, you may continue taking this during an active flare up  I have prescribed Indomethacin 50mg  three times daily for moderate to severe pain for no more than 7 days and I have prescribed Prednisone 40 mg daily for 7   HOME CARE Losing weight can help relieve gout. It's not clear that following a specific diet plan will help with gout symptoms but eating a balanced diet can help improve your overall health. It can also help you lose weight, if you are overweight. In general, a healthy diet includes plenty of fruits,  vegetables, whole grains, and low-fat dairy products (labelled "low fat", skim, 2%). Avoid sugar sweetened drinks (including sodas, tea, juice and juice blends, coffee drinks and sports drinks) Limit alcohol to 1-2 drinks of beer, spirits or wine daily these can make gout flares worse. Some people with gout also have other health problems, such as heart disease, high blood pressure, kidney disease, or obesity. If you have any of these issues, it's important to work with your doctor to manage them. This can help improve your overall health and might also help with your gout.  GET HELP RIGHT AWAY IF: . Your symptoms persist after you have completed your treatment plan . You develop severe diarrhea . You develop abnormal sensations .  You develop vomiting,  .  You develop weakness .  You develop abdominal pain  FOLLOW UP WITH YOUR PRIMARY PROVIDER IF: . If your symptoms do not improve within 10 days  MAKE SURE YOU   Understand these instructions.  Will watch your condition.  Will get help right away if you are not doing well or get worse.  Your e-visit answers were reviewed by a board certified advanced clinical practitioner to complete your personal care plan. Depending upon the condition, your plan could have included both over the counter or prescription medications.  Your safety is important to . If you have drug allergies  check your prescription carefully.   You can use MyChart to ask questions about today's visit, request a non-urgent call back, or ask for a work or school excuse for 24 hours related to this e-Visit. If it has been greater than 24 hours you will need to follow up with your provider, or enter a new e-Visit to address those concerns. You will get an e-mail with a link to a survey asking about your experience.  We hope that your e-visit has been valuable and will speed your recovery! Thank you for using e-visits.   5-10 minutes spent reviewing and documenting in  chart.

## 2020-06-15 ENCOUNTER — Telehealth: Payer: Self-pay | Admitting: Family

## 2020-06-15 DIAGNOSIS — R300 Dysuria: Secondary | ICD-10-CM

## 2020-06-15 NOTE — Progress Notes (Signed)
Based on what you shared with me, I feel your condition warrants further evaluation and I recommend that you be seen for a face to face office visit.  Male bladder infections are not very common.  We worry about prostate or kidney conditions.  The standard of care is to examine the abdomen and kidneys, and to do a urine and blood test to make sure that something more serious is not going on.  We recommend that you see a provider today.  If your doctor's office is closed [place] has the following Urgent Cares:    NOTE: If you entered your credit card information for this eVisit, you will not be charged. You may see a "hold" on your card for the $35 but that hold will drop off and you will not have a charge processed.   If you are having a true medical emergency please call 911.     For an urgent face to face visit, [place] has four urgent care centers for your convenience:    NEW:  [facility] [phone] [address] [place], [place] [address] .  Monday - Friday 10 am - 6 pm    . [facility]    [phone]                  Get Driving Directions  [address] [place], [place] [address] . 10 am to 8 pm Monday-Friday . 12 pm to 8 pm Saturday-Sunday   . [facility]  [phone]                  Get Driving Directions  [address], [address] [place], [place] [address] . 8 am to 8 pm Monday-Friday . 9 am to 6 pm Saturday . 11 am to 6 pm Sunday   . [facility] [place]  [phone]                  Get Driving Directions   [address] [place], [place] [address] . 8 am to 8 pm Monday-Friday . 8 am to 4 pm Saturday-Sunday    . [facility] [place]                    Get Driving Directions  [phone]  [address] , [place] [address]  . Monday-Friday, 12 PM to 6 PM    Your e-visit  answers were reviewed by a board certified advanced clinical practitioner to complete your personal care plan.  Thank you for using e-Visits.  

## 2020-06-26 ENCOUNTER — Encounter (HOSPITAL_COMMUNITY): Payer: Self-pay | Admitting: Emergency Medicine

## 2020-06-26 ENCOUNTER — Ambulatory Visit (HOSPITAL_COMMUNITY)
Admission: EM | Admit: 2020-06-26 | Discharge: 2020-06-26 | Disposition: A | Discharge status: Home / Self Care | Payer: Self-pay | Attending: Internal Medicine | Admitting: Internal Medicine

## 2020-06-26 ENCOUNTER — Other Ambulatory Visit: Payer: Self-pay

## 2020-06-26 DIAGNOSIS — R300 Dysuria: Secondary | ICD-10-CM | POA: Insufficient documentation

## 2020-06-26 LAB — POCT URINALYSIS DIPSTICK, ED / UC
Bilirubin Urine: NEGATIVE
Glucose, UA: NEGATIVE mg/dL
Ketones, ur: NEGATIVE mg/dL
Leukocytes,Ua: NEGATIVE
Nitrite: NEGATIVE
Protein, ur: NEGATIVE mg/dL
Specific Gravity, Urine: 1.02 (ref 1.005–1.030)
Urobilinogen, UA: 2 mg/dL — ABNORMAL HIGH (ref 0.0–1.0)
pH: 7 (ref 5.0–8.0)

## 2020-06-26 NOTE — ED Provider Notes (Signed)
[facility]    CSN: [id number] Arrival date & time: 06/26/20  1618   History   Chief Complaint Chief Complaint  Patient presents with  . Urinary Frequency    HPI Benjamin Meyer is a 44 y.o. male with history of hypertension and gout presents to urgent care with urinary complaints. Patient reports 1 week history of urinary urgency. He denies any pain with urination. Describes a "lingering tingling" sensation after he voids. He denies straining or difficulty starting stream. He denies any discharge from penis, no recent fever or chills, no gross hematuria   Past Medical History:  Diagnosis Date  . Gout   . Hypertension     There are no problems to display for this patient.   History reviewed. No pertinent surgical history.     Home Medications    Prior to Admission medications   Medication Sig Start Date End Date Taking? Authorizing Provider  amLODipine (NORVASC) 10 MG tablet Take 1 tablet (10 mg total) by mouth daily. 01/18/19   Eustace Moore, MD  ibuprofen (ADVIL) 800 MG tablet Take 1 tablet (800 mg total) by mouth every 8 (eight) hours as needed for moderate pain. 08/11/19   Daphine Deutscher, Mary-Margaret, FNP  indomethacin (INDOCIN) 50 MG capsule Take 1 capsule (50 mg total) by mouth 3 (three) times daily with meals. 05/11/20   Daphine Deutscher, Mary-Margaret, FNP  lisinopril-hydrochlorothiazide (ZESTORETIC) 20-12.5 MG tablet Take 1 tablet by mouth daily. 04/25/20   Gailen Shelter, PA  methocarbamol (ROBAXIN) 500 MG tablet Take 1 tablet (500 mg total) by mouth 2 (two) times daily. 04/25/20   Gailen Shelter, PA  predniSONE (DELTASONE) 20 MG tablet 2 po at sametime daily for 7 days 05/11/20   Bennie Pierini, FNP  colchicine 0.6 MG tablet Take 2 tabs immediately, then 1 tab twice per day for the duration of the flare up to a max of 7 days 10/15/19 04/21/20  McVey, Madelaine Bhat, PA-C    Family History Family History  Problem Relation Age of Onset  . Hypertension  Mother   . Diabetes Mother   . Heart failure Father   . Hypertension Father     Social History Social History   Tobacco Use  . Smoking status: Never Smoker  . Smokeless tobacco: Never Used  Vaping Use  . Vaping Use: Never used  Substance Use Topics  . Alcohol use: Yes    Comment: occasionally  . Drug use: No     Allergies   Patient has no known allergies.   Review of Systems As stated in HPI otherwise negative   Physical Exam Triage Vital Signs ED Triage Vitals  Enc Vitals Group     BP 06/26/20 1642 (!) 153/95     Pulse Rate 06/26/20 1642 88     Resp 06/26/20 1642 20     Temp 06/26/20 1642 (!) 97.4 F (36.3 C)     Temp src --      SpO2 06/26/20 1642 95 %     Weight --      Height --      Head Circumference --      Peak Flow --      Pain Score 06/26/20 1644 0     Pain Loc --      Pain Edu? --      Excl. in GC? --    No data found.  Updated Vital Signs BP (!) 153/95 (BP Location: Left Arm)   Pulse 88   Temp Marland Kitchen)  97.4 F (36.3 C)   Resp 20   SpO2 95%   Visual Acuity Right Eye Distance:   Left Eye Distance:   Bilateral Distance:    Right Eye Near:   Left Eye Near:    Bilateral Near:     Physical Exam Constitutional:      General: He is not in acute distress.    Appearance: Normal appearance. He is obese. He is not toxic-appearing.  Cardiovascular:     Rate and Rhythm: Normal rate and regular rhythm.  Pulmonary:     Effort: Pulmonary effort is normal.     Breath sounds: Normal breath sounds.  Abdominal:     General: Bowel sounds are normal.     Palpations: Abdomen is soft.     Tenderness: There is no abdominal tenderness. There is right CVA tenderness.  Skin:    General: Skin is warm and dry.  Neurological:     Mental Status: He is alert and oriented to person, place, and time.  Psychiatric:        Mood and Affect: Mood normal.        Behavior: Behavior normal.      UC Treatments / Results  Labs (all labs ordered are listed, but  only abnormal results are displayed) Labs Reviewed  URINE CULTURE - Abnormal; Notable for the following components:      Result Value   Culture   (*)    Value: <10,000 COLONIES/mL INSIGNIFICANT GROWTH Performed at [facility], [address], [place], [place] [address]    All other components within normal limits  POCT URINALYSIS DIPSTICK, ED / UC - Abnormal; Notable for the following components:   Hgb urine dipstick TRACE (*)    Urobilinogen, UA 2.0 (*)    All other components within normal limits  CYTOLOGY, (ORAL, ANAL, URETHRAL) ANCILLARY ONLY    EKG   Radiology No results found.  Procedures Procedures (including critical care time)  Medications Ordered in UC Medications - No data to display  Initial Impression / Assessment and Plan / UC Course  I have reviewed the triage vital signs and the nursing notes.  Pertinent labs & imaging results that were available during my care of the patient were reviewed by me and considered in my medical decision making (see chart for details).   Dysuria -Unclear etiology. Symptoms ongoing x1 week. Patient is afebrile and nontoxic-appearing -No evidence of UTI -Mild right CVA tenderness on exam and with trace hemoglobin in urine, patient could have renal stone -Will culture urine, STI screening. No indication for antibiotics at this time -Patient encouraged to push fluids. Would need to go to ER for any fever and/or worsening pain  Reviewed expections re: course of current medical issues. Questions answered. Outlined signs and symptoms indicating need for more acute intervention. Pt verbalized understanding. AVS given  Final Clinical Impressions(s) / UC Diagnoses   Final diagnoses:  Dysuria     Discharge Instructions     It does not look like you have a urinary tract infection.  I am sending your urine to be cultured.  We will call you if you need any antibiotics.  You have also been screened today for sexually  transmitted infections.  We will also call should you require any treatment.  Please return or go to ER for any pain, fever or chills.    ED Prescriptions    None     PDMP not reviewed this encounter.   Rolla Etienne,  NP 06/28/20 1724

## 2020-06-26 NOTE — ED Triage Notes (Signed)
Pt states that he has UTI sx and sx started a week ago.

## 2020-06-26 NOTE — Discharge Instructions (Addendum)
It does not look like you have a urinary tract infection.  I am sending your urine to be cultured.  We will call you if you need any antibiotics.  You have also been screened today for sexually transmitted infections.  We will also call should you require any treatment.  Please return or go to ER for any pain, fever or chills.

## 2020-06-27 LAB — CYTOLOGY, (ORAL, ANAL, URETHRAL) ANCILLARY ONLY
Chlamydia: NEGATIVE
Comment: NEGATIVE
Comment: NEGATIVE
Comment: NORMAL
Neisseria Gonorrhea: NEGATIVE
Trichomonas: NEGATIVE

## 2020-06-27 LAB — URINE CULTURE: Culture: <10000 — AB

## 2020-06-28 ENCOUNTER — Telehealth: Payer: Self-pay | Admitting: Nurse Practitioner

## 2020-06-28 DIAGNOSIS — J020 Streptococcal pharyngitis: Secondary | ICD-10-CM

## 2020-06-28 MED ORDER — AMOXICILLIN 500 MG PO CAPS: 500.0000 mg | ORAL_CAPSULE | Freq: Two times a day (BID) | ORAL | 0 refills | Status: DC

## 2020-06-28 NOTE — Progress Notes (Signed)

## 2020-08-30 ENCOUNTER — Telehealth: Payer: Self-pay | Admitting: Physician Assistant

## 2020-08-30 DIAGNOSIS — M109 Gout, unspecified: Secondary | ICD-10-CM

## 2020-08-30 MED ORDER — INDOMETHACIN 25 MG PO CAPS: 25.0000 mg | ORAL_CAPSULE | Freq: Three times a day (TID) | ORAL | 0 refills | Status: DC | PRN

## 2020-08-30 MED ORDER — COLCHICINE 0.6 MG PO TABS: ORAL_TABLET | ORAL | 0 refills | Status: DC

## 2020-08-30 NOTE — Progress Notes (Signed)
E-Visit for Gout  We are sorry that you are not feeling well. We are here to help!  Based on what you shared with me it looks like you have a flare of your gout.  Gout is a form of arthritis. It can cause pain and swelling in the joints. At first, it tends to affect only 1 joint - most frequently the big toe. It happens in people who have too much uric acid in the blood. Uric acid is a chemical that is produced when the body breaks down certain foods. Uric acid can form sharp needle-like crystals that build up in the joints and cause pain. Uric acid crystals can also form inside the tubes that carry urine from the kidneys to the bladder. These crystals can turn into "kidney stones" that can cause pain and problems with the flow of urine. People with gout get sudden "flares" or attacks of severe pain, most often the big toe, ankle, or knee. Often the joint also turns red and swells. Usually, only 1 joint is affected, but some people have pain in more than 1 joint. Gout flares tend to happen more often during the night.  The pain from gout can be extreme. The pain and swelling are worst at the beginning of a gout flare. The symptoms then get better within a few days to weeks. It is not clear how the body "turns off" a gout flare.  Do not start any NEW preventative medicine until the gout has cleared completely. However, If you are already on Probenecid or Allopurinol for CHRONIC gout, you may continue taking this during an active flare up  I have prescribed Indomethacin 50mg  three times daily for moderate to severe pain for no more than 7 days and I have prescribed Colchicine 0.6 mg tabs - Take 2 tabs immediately, then 1 tab twice per day for the duration of the flare up to a max of 7 days (but discontinue for stomach pains or diarrhea)    HOME CARE Losing weight can help relieve gout. It's not clear that following a specific diet plan will help with gout symptoms but eating a balanced diet can help  improve your overall health. It can also help you lose weight, if you are overweight. In general, a healthy diet includes plenty of fruits, vegetables, whole grains, and low-fat dairy products (labelled "low fat", skim, 2%). Avoid sugar sweetened drinks (including sodas, tea, juice and juice blends, coffee drinks and sports drinks) Limit alcohol to 1-2 drinks of beer, spirits or wine daily these can make gout flares worse. Some people with gout also have other health problems, such as heart disease, high blood pressure, kidney disease, or obesity. If you have any of these issues, it's important to work with your doctor to manage them. This can help improve your overall health and might also help with your gout.  GET HELP RIGHT AWAY IF: . Your symptoms persist after you have completed your treatment plan . You develop severe diarrhea . You develop abnormal sensations .  You develop vomiting,  .  You develop weakness .  You develop abdominal pain  FOLLOW UP WITH YOUR PRIMARY PROVIDER IF: . If your symptoms do not improve within 10 days  MAKE SURE YOU   Understand these instructions.  Will watch your condition.  Will get help right away if you are not doing well or get worse.  Your e-visit answers were reviewed by a board certified advanced clinical practitioner to complete your personal  care plan. Depending upon the condition, your plan could have included both over the counter or prescription medications.  Your safety is important to [place]. If you have drug allergies check your prescription carefully.   You can use MyChart to ask questions about today's visit, request a non-urgent call back, or ask for a work or school excuse for 24 hours related to this e-Visit. If it has been greater than 24 hours you will need to follow up with your provider, or enter a new e-Visit to address those concerns. You will get an e-mail with a link to a survey asking about your experience.  We hope that your  e-visit has been valuable and will speed your recovery! Thank you for using e-visits.      Greater than 5 minutes, yet less than 10 minutes of time have been spent researching, coordinating, and implementing care for this patient today

## 2020-09-15 ENCOUNTER — Telehealth: Payer: Self-pay | Admitting: Emergency Medicine

## 2020-09-15 DIAGNOSIS — B001 Herpesviral vesicular dermatitis: Secondary | ICD-10-CM

## 2020-09-15 MED ORDER — VALACYCLOVIR HCL 1 G PO TABS: 2000.0000 mg | ORAL_TABLET | Freq: Two times a day (BID) | ORAL | 0 refills | Status: AC

## 2020-09-15 NOTE — Progress Notes (Signed)
We are sorry that you are not feeling well.  Here is how we plan to help!  Based on what you have shared with me it does look like you have a viral infection.    Most cold sores or fever blisters are small fluid filled blisters around the mouth caused by herpes simplex virus.  The most common strain of the virus causing cold sores is herpes simplex virus 1.  It can be spread by skin contact, sharing eating utensils, or even sharing towels.  Cold sores are contagious to other people until dry. (Approximately 5-7 days).  Wash your hands. You can spread the virus to your eyes through handling your contact lenses after touching the lesions.  Most people experience pain at the sight or tingling sensations in their lips that may begin before the ulcers erupt.  Herpes simplex is treatable but not curable.  It may lie dormant for a long time and then reappear due to stress or prolonged sun exposure.  Many patients have success in treating their cold sores with an over the counter topical called Abreva.  You may apply the cream up to 5 times daily (maximum 10 days) until healing occurs.  If you would like to use an oral antiviral medication to speed the healing of your cold sore, I have sent a prescription to your local pharmacy Valacyclovir 2 gm take one by mouth twice a day for 1 day.    HOME CARE:  Wash your hands frequently. Do not pick at or rub the sore. Don't open the blisters. Avoid kissing other people during this time. Avoid sharing drinking glasses, eating utensils, or razors. Do not handle contact lenses unless you have thoroughly washed your hands with soap and warm water! Avoid oral sex during this time.  Herpes from sores on your mouth can spread to your partner's genital area. Avoid contact with anyone who has eczema or a weakened immune system. Cold sores are often triggered by exposure to intense sunlight, use a lip balm containing a sunscreen (SPF 30 or higher).  GET HELP RIGHT AWAY  IF:  Blisters look infected. Blisters occur near or in the eye. Symptoms last longer than 10 days. Your symptoms become worse.  MAKE SURE YOU:  Understand these instructions. Will watch your condition. Will get help right away if you are not doing well or get worse.    Your e-visit answers were reviewed by a board certified advanced clinical practitioner to complete your personal care plan.  Depending upon the condition, your plan could have  Included both over the counter or prescription medications.    Please review your pharmacy choice.  Be sure that the pharmacy you have chosen is open so that you can pick up your prescription now.  If there is a problem you can message your provider in MyChart to have the prescription routed to another pharmacy.    Your safety is important to us.  If you have drug allergies check our prescription carefully.  For the next 24 hours you can use MyChart to ask questions about today's visit, request a non-urgent call back, or ask for a work or school excuse from your e-visit provider.  You will get an email in the next two days asking about your experience.  I hope that your e-visit has been valuable and will speed your recovery.  Approximately 5 minutes was spent documenting and reviewing patient's chart.    

## 2020-09-30 ENCOUNTER — Telehealth: Payer: Self-pay | Admitting: Physician Assistant

## 2020-09-30 DIAGNOSIS — M5441 Lumbago with sciatica, right side: Secondary | ICD-10-CM

## 2020-09-30 MED ORDER — NAPROXEN 500 MG PO TABS: 500.0000 mg | ORAL_TABLET | Freq: Two times a day (BID) | ORAL | 0 refills | Status: DC

## 2020-09-30 MED ORDER — TIZANIDINE HCL 2 MG PO TABS: 4.0000 mg | ORAL_TABLET | Freq: Three times a day (TID) | ORAL | 0 refills | Status: DC | PRN

## 2020-09-30 NOTE — Progress Notes (Signed)
Based on what you shared with me, I feel your condition warrants further evaluation and I recommend that you be seen for a face to face office visit.  Given that your pain does not improve with positions, there may be something more serious going on and a face to face visit can help [place] sort that out.  Additionally, we do not refill medications so a face to face visit can take care of both!   NOTE: If you entered your credit card information for this eVisit, you will not be charged. You may see a "hold" on your card for the $35 but that hold will drop off and you will not have a charge processed.   If you are having a true medical emergency please call 911.      For an urgent face to face visit, [facility] has five urgent care centers for your convenience:     [facility] at [facility] Directions [phone] [address] [place], [place] [address] . 10 am - 6pm Monday - Friday    [facility]) Get Driving Directions [phone] [address] [place], [place] [address] . 10 am to 8 pm Monday-Friday . 12 pm to 8 pm [facility] Urgent Care at [facility] Get Driving Directions [phone] [address] [place], [place] [address] . 8 am to 8 pm Monday-Friday . 9 am to 6 pm Saturday . 11 am to 6 pm Sunday     [facility] at [facility] Get Driving Directions  [phone] [address] [place], [place] [address] . 8 am to 8 pm Monday-Friday . 8 am to 4 pm [facility] Urgent Care at [facility] Directions [phone] [address] [place], [place] [address] . 12 pm to 6 pm Monday-Friday      Your e-visit answers were reviewed by a board certified advanced clinical practitioner to complete your personal care plan.  Thank you for using e-Visits.    Greater than 5 minutes, yet less than 10  minutes of time have been spent researching, coordinating, and implementing care for this patient today

## 2020-09-30 NOTE — Progress Notes (Signed)

## 2020-09-30 NOTE — Addendum Note (Signed)
Addended by: Dierdre Forth on: 09/30/2020 11:40 AM   Modules accepted: Orders

## 2020-10-13 ENCOUNTER — Telehealth: Payer: Self-pay | Admitting: Orthopedic Surgery

## 2020-10-13 DIAGNOSIS — R509 Fever, unspecified: Secondary | ICD-10-CM

## 2020-10-13 NOTE — Progress Notes (Signed)
Based on what you shared with me, I feel your condition warrants further evaluation and I recommend that you be seen for a face to face visit.  Please contact your primary care physician practice to be seen. Many offices offer virtual options to be seen via video if you are not comfortable going in person to a medical facility at this time.  If you do not have a PCP, [facility] offers a free physician referral service available at [phone]. Our trained staff has the experience, knowledge and resources to put you in touch with a physician who is right for you.   You also have the option of a video visit through [email or web address]  If you are having a true medical emergency please call 911.  NOTE: If you entered your credit card information for this eVisit, you will not be charged. You may see a "hold" on your card for the $35 but that hold will drop off and you will not have a charge processed.  Your e-visit answers were reviewed by a board certified advanced clinical practitioner to complete your personal care plan.  Thank you for using e-Visits.  

## 2020-11-20 ENCOUNTER — Emergency Department (HOSPITAL_COMMUNITY)
Admission: EM | Admit: 2020-11-20 | Discharge: 2020-11-21 | Disposition: A | Discharge status: Home / Self Care | Payer: Self-pay | Attending: Emergency Medicine | Admitting: Emergency Medicine

## 2020-11-20 DIAGNOSIS — I10 Essential (primary) hypertension: Secondary | ICD-10-CM | POA: Insufficient documentation

## 2020-11-20 DIAGNOSIS — Z79899 Other long term (current) drug therapy: Secondary | ICD-10-CM | POA: Insufficient documentation

## 2020-11-20 DIAGNOSIS — Z760 Encounter for issue of repeat prescription: Secondary | ICD-10-CM | POA: Insufficient documentation

## 2020-11-20 NOTE — ED Triage Notes (Signed)
Pt came in with c/o being out of his BP medication for 3 weeks. He does not have a PCP. Current BP 157/98

## 2020-11-21 MED ORDER — AMLODIPINE BESYLATE 10 MG PO TABS: 10.0000 mg | ORAL_TABLET | Freq: Every day | ORAL | 0 refills | Status: DC

## 2020-11-21 MED ORDER — LISINOPRIL-HYDROCHLOROTHIAZIDE 20-12.5 MG PO TABS: 1.0000 | ORAL_TABLET | Freq: Every day | ORAL | 0 refills | Status: DC

## 2020-11-21 NOTE — Discharge Instructions (Addendum)
Follow-up with a primary care doctor to better manage your chronic problems.

## 2020-11-21 NOTE — ED Provider Notes (Signed)
[facility]-EMERGENCY DEPT Provider Note   CSN: [id number] Arrival date & time: 11/20/20  2338     History Chief Complaint  Patient presents with  . Medication Refill    Benjamin Meyer is a 45 y.o. male.  HPI Patient presents for refill of his blood pressure medicines.  States has been out of them for about 3 weeks.  Patient states he longer has a PCP.  States he knows he needs to follow-up with a primary doctor.  States he is feeling fine.  No chest pain.  No trouble breathing.  No headache.  States he is actually lost 40 pounds over the last 6 months by eating better and exercising more.  States he had not been following up partially due to COVID.  After discussion states he knows the importance of PCP follow-up.  No swelling in his legs.    Past Medical History:  Diagnosis Date  . Gout   . Hypertension     There are no problems to display for this patient.   No past surgical history on file.     Family History  Problem Relation Age of Onset  . Hypertension Mother   . Diabetes Mother   . Heart failure Father   . Hypertension Father     Social History   Tobacco Use  . Smoking status: Never Smoker  . Smokeless tobacco: Never Used  Vaping Use  . Vaping Use: Never used  Substance Use Topics  . Alcohol use: Yes    Comment: occasionally  . Drug use: No    Home Medications Prior to Admission medications   Medication Sig Start Date End Date Taking? Authorizing Provider  amLODipine (NORVASC) 10 MG tablet Take 1 tablet (10 mg total) by mouth daily. 11/21/20   Benjiman Core, MD  amoxicillin (AMOXIL) 500 MG capsule Take 1 capsule (500 mg total) by mouth 2 (two) times daily. Patient not taking: Reported on 09/15/2020 06/28/20   Bennie Pierini, FNP  colchicine 0.6 MG tablet Take 2 tabs immediately, then 1 tab twice per day for the duration of the flare up to a max of 7 days (but discontinue for stomach pains or diarrhea) 08/30/20    Muthersbaugh, Dahlia Client, PA-C  indomethacin (INDOCIN) 25 MG capsule Take 1 capsule (25 mg total) by mouth 3 (three) times daily as needed. 08/30/20   Muthersbaugh, Dahlia Client, PA-C  lisinopril-hydrochlorothiazide (ZESTORETIC) 20-12.5 MG tablet Take 1 tablet by mouth daily. 11/21/20   Benjiman Core, MD  methocarbamol (ROBAXIN) 500 MG tablet Take 1 tablet (500 mg total) by mouth 2 (two) times daily. 04/25/20   Gailen Shelter, PA  naproxen (NAPROSYN) 500 MG tablet Take 1 tablet (500 mg total) by mouth 2 (two) times daily with a meal. 09/30/20   Muthersbaugh, Dahlia Client, PA-C  predniSONE (DELTASONE) 20 MG tablet 2 po at sametime daily for 7 days 05/11/20   Daphine Deutscher, Mary-Margaret, FNP  tiZANidine (ZANAFLEX) 2 MG tablet Take 2 tablets (4 mg total) by mouth every 8 (eight) hours as needed for muscle spasms. 09/30/20   Muthersbaugh, Dahlia Client, PA-C    Allergies    Patient has no known allergies.  Review of Systems   Review of Systems  Constitutional: Negative for chills and unexpected weight change.  Respiratory: Negative for shortness of breath.   Cardiovascular: Negative for chest pain and leg swelling.  Gastrointestinal: Negative for abdominal pain.  Neurological: Negative for weakness and headaches.    Physical Exam Updated Vital Signs BP (!) 157/98 (BP Location:  Left Arm)   Pulse 94   Temp 98.1 F (36.7 C) (Oral)   Resp 18   SpO2 97%   Physical Exam Vitals reviewed.  Constitutional:      Appearance: He is obese.  HENT:     Head: Normocephalic.  Cardiovascular:     Rate and Rhythm: Regular rhythm.  Pulmonary:     Breath sounds: No wheezing or rhonchi.  Musculoskeletal:        General: No tenderness.  Skin:    General: Skin is warm.     Capillary Refill: Capillary refill takes less than 2 seconds.  Neurological:     Mental Status: He is oriented to person, place, and time.     ED Results / Procedures / Treatments   Labs (all labs ordered are listed, but only abnormal results are  displayed) Labs Reviewed - No data to display  EKG None  Radiology No results found.  Procedures Procedures   Medications Ordered in ED Medications - No data to display  ED Course  I have reviewed the triage vital signs and the nursing notes.  Pertinent labs & imaging results that were available during my care of the patient were reviewed by me and considered in my medical decision making (see chart for details).    MDM Rules/Calculators/A&P                          Patient presents for medication refill.  Patient does have hypertension but is asymptomatic.  Patient has been eating better and losing weight and patient was encouraged to keep this up.  States he will also find his PCP to follow-up with.  I will refill 1 month supply of his medicines.  Discharge home.  Does not appear to need further work-up at this time. Final Clinical Impression(s) / ED Diagnoses Final diagnoses:  Medication refill  Hypertension, unspecified type    Rx / DC Orders ED Discharge Orders         Ordered    amLODipine (NORVASC) 10 MG tablet  Daily        11/21/20 0011    lisinopril-hydrochlorothiazide (ZESTORETIC) 20-12.5 MG tablet  Daily        11/21/20 0011           Benjiman Core, MD 11/21/20 0015

## 2020-11-27 ENCOUNTER — Telehealth: Payer: Self-pay | Admitting: Physician Assistant

## 2020-11-27 DIAGNOSIS — H8110 Benign paroxysmal vertigo, unspecified ear: Secondary | ICD-10-CM

## 2020-11-27 DIAGNOSIS — B9789 Other viral agents as the cause of diseases classified elsewhere: Secondary | ICD-10-CM

## 2020-11-27 MED ORDER — MECLIZINE HCL 25 MG PO TABS: 25.0000 mg | ORAL_TABLET | Freq: Three times a day (TID) | ORAL | 0 refills | Status: DC | PRN

## 2020-11-27 NOTE — Progress Notes (Signed)
E Visit for Motion Sickness  We are sorry that you are not feeling well. Here is how we plan to help!  Based on what you have shared with me it looks like you have symptoms of positional vertigo  I have prescribed a medication that will help prevent or alleviate your symptoms: Meclizine 25mg  by mouth three times per day as needed for nausea/dizziness.  There can be other causes of current symptoms. I want you to keep well-hydrated and make sure to get plenty of rest. If symptoms are not improving within next 24 hours or anything worsens, new symptoms develop, you need to be seen in person at local Urgent Care or ER ASAP.    Prevention:  You might feel better if you keep your eyes focused on outside while you are in motion. For example, if you are in a car, sit in the front and look in the direction you are moving; if you are on a boat, stay on the deck and look to the horizon. This helps make what you see match the movement you are feeling, and so you are less likely to feel sick.  You should also avoid reading, watching a movie, texting or reading messages, or looking at things close to you inside the vehicle you are riding in.  . Use the seat head rest. Lean your head against the back of the seat or head rest when traveling in vehicles with seats to minimize head movements.  . On a ship: When making your reservations, choose a cabin in the middle of the ship and near the waterline. When on board, go up on deck and focus on the horizon.  . In an airplane: Request a window seat and look out the window. A seat over the front edge of the wing is the most preferable spot (the degree of motion is the lowest here). Direct the air vent to blow cool air on your face.  . On a train: Always face forward and sit near a window.  . In a vehicle: Sit in the front seat; if you are the passenger, look at the scenery in the distance. For some people, driving the vehicle (rather than being a passenger) is  an instant remedy.  . Avoid others who have become nauseous with motion sickness. Seeing and smelling others who have motion sickness may cause you to become sick.  GET HELP RIGHT AWAY IF:   Your symptoms do not improve or worsen within 2 days after treatment.   You cannot keep down fluids after trying the medication.   Other associated symptoms such as severe headache, visual field changes, fever, or intractable nausea and vomiting.  MAKE SURE YOU:   Understand these instructions.  Will watch your condition.  Will get help right away if you are not doing well or get worse.  Thank you for choosing an e-visit.  Your e-visit answers were reviewed by a board certified advanced clinical practitioner to complete your personal care plan. Depending upon the condition, your plan could have included both over the counter or prescription medications.  Please review your pharmacy choice. Be sure that the pharmacy you have chosen is open so that you can pick up your prescription now.  If there is a problem you may message your provider in MyChart to have the prescription routed to another pharmacy.  Your safety is important to . If you have drug allergies check your prescription carefully.   For the next 24 hours, you can  use MyChart to ask questions about today's visit, request a non-urgent call back, or ask for a work or school excuse from your e-visit provider.  You will get an e-mail in the next two days asking about your experience. I hope that your e-visit has been valuable and will speed your recovery.   References or for more information: [email or web address]/ https://my.https://rowe.info/ https://www.uptodate.com

## 2020-11-27 NOTE — Progress Notes (Signed)
We are sorry that you are not feeling well.  Here is how we plan to help!  Based on what you have shared with me it looks like you have sinusitis.  Sinusitis is inflammation and infection in the sinus cavities of the head.  Based on your presentation I believe you most likely have Acute Viral Sinusitis.This is an infection most likely caused by a virus. There is not specific treatment for viral sinusitis other than to help you with the symptoms until the infection runs its course.  You may use an oral decongestant such as Mucinex D or if you have glaucoma or high blood pressure use plain Mucinex. Saline nasal spray help and can safely be used as often as needed for congestion. I do recommend a nasal steroid like Nasacort, Nasonex or Flonase OTC because these will help alleviate the pressure in the inner ears and will also help resolve any dizziness that can go along with nasal congestion/sinus pressure.   Some authorities believe that zinc sprays or the use of Echinacea may shorten the course of your symptoms.  Sinus infections are not as easily transmitted as other respiratory infection, however we still recommend that you avoid close contact with loved ones, especially the very young and elderly.  Remember to wash your hands thoroughly throughout the day as this is the number one way to prevent the spread of infection!  Providers prescribe antibiotics to treat infections caused by bacteria. Antibiotics are very powerful in treating bacterial infections when they are used properly. To maintain their effectiveness, they should be used only when necessary. Overuse of antibiotics has resulted in the development of superbugs that are resistant to treatment!    After careful review of your answers, I would not recommend an antibiotic for your condition.  Antibiotics are not effective against viruses and therefore should not be used to treat them. Common examples of infections caused by viruses include colds  and flu    Home Care:  Only take medications as instructed by your medical team.  Do not take these medications with alcohol.  A steam or ultrasonic humidifier can help congestion.  You can place a towel over your head and breathe in the steam from hot water coming from a faucet.  Avoid close contacts especially the very young and the elderly.  Cover your mouth when you cough or sneeze.  Always remember to wash your hands.  Get Help Right Away If:  You develop worsening fever or sinus pain.  You develop a severe head ache or visual changes.  Your symptoms persist after you have completed your treatment plan.  Make sure you  Understand these instructions.  Will watch your condition.  Will get help right away if you are not doing well or get worse.  Your e-visit answers were reviewed by a board certified advanced clinical practitioner to complete your personal care plan.  Depending on the condition, your plan could have included both over the counter or prescription medications.  If there is a problem please reply  once you have received a response from your provider.  Your safety is important to [place].  If you have drug allergies check your prescription carefully.    You can use MyChart to ask questions about today's visit, request a non-urgent call back, or ask for a work or school excuse for 24 hours related to this e-Visit. If it has been greater than 24 hours you will need to follow up with your provider, or enter  a new e-Visit to address those concerns.  You will get an e-mail in the next two days asking about your experience.  I hope that your e-visit has been valuable and will speed your recovery. Thank you for using e-visits.

## 2020-11-27 NOTE — Progress Notes (Signed)
Message sent to patient requesting further input regarding current symptoms. Awaiting patient response.  

## 2020-11-27 NOTE — Progress Notes (Signed)
I have spent 5 minutes in review of e-visit questionnaire, review and updating patient chart, medical decision making and response to patient.   Aalijah Cody Aiko Belko, PA-C    

## 2020-11-27 NOTE — Progress Notes (Signed)
I have spent 5 minutes in review of e-visit questionnaire, review and updating patient chart, medical decision making and response to patient.   [place] Cody Sulamita Lafountain, PA-C    

## 2020-12-04 ENCOUNTER — Telehealth: Payer: Self-pay | Admitting: Family

## 2020-12-04 DIAGNOSIS — H5710 Ocular pain, unspecified eye: Secondary | ICD-10-CM

## 2020-12-04 NOTE — Progress Notes (Signed)
Based on what you shared with me, I feel your condition warrants further evaluation and I recommend that you be seen in a face to face office visit.  Given you have had an eye injury it would be best to be seen in person to be evaluated.    NOTE: If you entered your credit card information for this eVisit, you will not be charged. You may see a "hold" on your card for the $35 but that hold will drop off and you will not have a charge processed.   If you are having a true medical emergency please call 911.      For an urgent face to face visit, [place] has six urgent care centers for your convenience:     [facility] at [facility] Directions [phone] [address] [place], [place] [address] . 8 am - 4 pm Monday - Friday    [facility]) Get Driving Directions [phone] [address] [place], [place] [address] . 8 am to 8 pm Monday-Friday . 10 am to 6 pm [facility]) Get Driving Directions [phone]  [address] [place],  [place]  [address] . 8 am to 8 pm Monday-Friday . 8 am to 4 pm [facility] at [facility] Get Driving Directions [phone] [address] [place], [place] [address] . 8 am to 8 pm Monday-Friday . 8 am to 4 pm [facility] at [facility] Get Driving Directions  [phone] [address] [place], [place] [address] . 8 am to 8 pm Monday-Friday . 8 am to 4 pm [facility] Care at [facility] Directions [phone] [address] [place], [place] [address] . 8 am to 8 pm Monday-Friday . 8 am to 4 pm Saturday-Sunday     Your MyChart E-visit questionnaire answers were reviewed by a board certified advanced clinical practitioner to complete your  personal care plan based on your specific symptoms.  Thank you for using e-Visits.

## 2021-01-22 ENCOUNTER — Telehealth: Payer: Self-pay | Admitting: Family

## 2021-01-22 DIAGNOSIS — B9689 Other specified bacterial agents as the cause of diseases classified elsewhere: Secondary | ICD-10-CM

## 2021-01-22 DIAGNOSIS — J208 Acute bronchitis due to other specified organisms: Secondary | ICD-10-CM

## 2021-01-22 MED ORDER — BENZONATATE 100 MG PO CAPS: 100.0000 mg | ORAL_CAPSULE | Freq: Three times a day (TID) | ORAL | 0 refills | Status: DC | PRN

## 2021-01-22 MED ORDER — DOXYCYCLINE HYCLATE 100 MG PO TABS: 100.0000 mg | ORAL_TABLET | Freq: Two times a day (BID) | ORAL | 0 refills | Status: DC

## 2021-01-22 NOTE — Progress Notes (Signed)

## 2021-01-25 ENCOUNTER — Telehealth: Payer: Self-pay | Admitting: Physician Assistant

## 2021-01-25 DIAGNOSIS — R059 Cough, unspecified: Secondary | ICD-10-CM

## 2021-01-25 MED ORDER — PROMETHAZINE-DM 6.25-15 MG/5ML PO SYRP: 5.0000 mL | ORAL_SOLUTION | Freq: Four times a day (QID) | ORAL | 0 refills | Status: DC | PRN

## 2021-01-25 NOTE — Progress Notes (Signed)
We are sorry that you are not feeling well.  Here is how we plan to help!  Based on your presentation I believe you should continue care discussed at last visit including completion of antibiotic. (See below). I am glad you are feeling better overall. Since the benzonatate is not helping with cough I will send in an alternative prescription cough medication for you to use.     From your responses in the eVisit questionnaire you describe inflammation in the upper respiratory tract which is causing a significant cough.  This is commonly called Bronchitis and has four common causes:   Allergies Viral Infections Acid Reflux Bacterial Infection Allergies, viruses and acid reflux are treated by controlling symptoms or eliminating the cause. An example might be a cough caused by taking certain blood pressure medications. You stop the cough by changing the medication. Another example might be a cough caused by acid reflux. Controlling the reflux helps control the cough.  USE OF BRONCHODILATOR ("RESCUE") INHALERS: There is a risk from using your bronchodilator too frequently.  The risk is that over-reliance on a medication which only relaxes the muscles surrounding the breathing tubes can reduce the effectiveness of medications prescribed to reduce swelling and congestion of the tubes themselves.  Although you feel brief relief from the bronchodilator inhaler, your asthma may actually be worsening with the tubes becoming more swollen and filled with mucus.  This can delay other crucial treatments, such as oral steroid medications. If you need to use a bronchodilator inhaler daily, several times per day, you should discuss this with your provider.  There are probably better treatments that could be used to keep your asthma under control.     HOME CARE Only take medications as instructed by your medical team. Complete the entire course of an antibiotic. Drink plenty of fluids and get plenty of rest. Avoid  close contacts especially the very young and the elderly Cover your mouth if you cough or cough into your sleeve. Always remember to wash your hands A steam or ultrasonic humidifier can help congestion.   GET HELP RIGHT AWAY IF: You develop worsening fever. You become short of breath You cough up blood. Your symptoms persist after you have completed your treatment plan MAKE SURE YOU  Understand these instructions. Will watch your condition. Will get help right away if you are not doing well or get worse.    Thank you for choosing an e-visit.  Your e-visit answers were reviewed by a board certified advanced clinical practitioner to complete your personal care plan. Depending upon the condition, your plan could have included both over the counter or prescription medications.  Please review your pharmacy choice. Make sure the pharmacy is open so you can pick up prescription now. If there is a problem, you may contact your provider through [employer] and have the prescription routed to another pharmacy.  Your safety is important to [place]. If you have drug allergies check your prescription carefully.   For the next 24 hours you can use MyChart to ask questions about today's visit, request a non-urgent call back, or ask for a work or school excuse. You will get an email in the next two days asking about your experience. I hope that your e-visit has been valuable and will speed your recovery.

## 2021-01-25 NOTE — Progress Notes (Signed)
I have spent 5 minutes in review of e-visit questionnaire, review and updating patient chart, medical decision making and response to patient.   Unnamed Cody Woodley Petzold, PA-C    

## 2021-02-09 ENCOUNTER — Telehealth: Payer: Self-pay | Admitting: Physician Assistant

## 2021-02-09 DIAGNOSIS — K219 Gastro-esophageal reflux disease without esophagitis: Secondary | ICD-10-CM

## 2021-02-09 MED ORDER — OMEPRAZOLE 20 MG PO CPDR: 20.0000 mg | DELAYED_RELEASE_CAPSULE | Freq: Every day | ORAL | 0 refills | Status: DC

## 2021-02-09 NOTE — Progress Notes (Signed)
I have spent 5 minutes in review of e-visit questionnaire, review and updating patient chart, medical decision making and response to patient.   Tony Cody Tatum Corl, PA-C    

## 2021-02-09 NOTE — Progress Notes (Signed)
E-Visit for Heartburn ° °We are sorry that you are not feeling well.  Here is how we plan to help! ° °Based on what you shared with me it looks like you most likely have Gastroesophageal Reflux Disease (GERD) ° °Gastroesophageal reflux disease (GERD) happens when acid from your stomach flows up into the esophagus.  When acid comes in contact with the esophagus, the acid causes sorenss (inflammation) in the esophagus.  Over time, GERD may create small holes (ulcers) in the lining of the esophagus. ° °I have prescribed Omeprazole 20 mg one by mouth daily until you follow up with a provider. ° °Your symptoms should improve in the next day or two.  You can use antacids as needed until symptoms resolve.  Call us if your heartburn worsens, you have trouble swallowing, weight loss, spitting up blood or recurrent vomiting. ° °Home Care: °May include lifestyle changes such as weight loss, quitting smoking and alcohol consumption °Avoid foods and drinks that make your symptoms worse, such as: °Caffeine or alcoholic drinks °Chocolate °Peppermint or mint flavorings °Garlic and onions °Spicy foods °Citrus fruits, such as oranges, lemons, or limes °Tomato-based foods such as sauce, chili, salsa and pizza °Fried and fatty foods °Avoid lying down for 3 hours prior to your bedtime or prior to taking a nap °Eat small, frequent meals instead of a large meals °Wear loose-fitting clothing.  Do not wear anything tight around your waist that causes pressure on your stomach. °Raise the head of your bed 6 to 8 inches with wood blocks to help you sleep.  Extra pillows will not help. ° °Seek Help Right Away If: °You have pain in your arms, neck, jaw, teeth or back °Your pain increases or changes in intensity or duration °You develop nausea, vomiting or sweating (diaphoresis) °You develop shortness of breath or you faint °Your vomit is green, yellow, black or looks like coffee grounds or blood °Your stool is red, bloody or black ° °These  symptoms could be signs of other problems, such as heart disease, gastric bleeding or esophageal bleeding. ° °Make sure you : °Understand these instructions. °Will watch your condition. °Will get help right away if you are not doing well or get worse. ° °Your e-visit answers were reviewed by a board certified advanced clinical practitioner to complete your personal care plan.  Depending on the condition, your plan could have included both over the counter or prescription medications. ° °If there is a problem please reply  once you have received a response from your provider. ° °Your safety is important to us.  If you have drug allergies check your prescription carefully.   ° °You can use MyChart to ask questions about today’s visit, request a non-urgent call back, or ask for a work or school excuse for 24 hours related to this e-Visit. If it has been greater than 24 hours you will need to follow up with your provider, or enter a new e-Visit to address those concerns. ° °You will get an e-mail in the next two days asking about your experience.  I hope that your e-visit has been valuable and will speed your recovery. Thank you for using e-visits. ° ° °

## 2021-03-17 ENCOUNTER — Telehealth: Payer: Self-pay | Admitting: Physician Assistant

## 2021-03-17 DIAGNOSIS — M10079 Idiopathic gout, unspecified ankle and foot: Secondary | ICD-10-CM

## 2021-03-17 MED ORDER — COLCHICINE 0.6 MG PO TABS: ORAL_TABLET | ORAL | 0 refills | Status: DC

## 2021-03-17 MED ORDER — INDOMETHACIN 50 MG PO CAPS: 50.0000 mg | ORAL_CAPSULE | Freq: Three times a day (TID) | ORAL | 0 refills | Status: DC

## 2021-03-17 NOTE — Progress Notes (Signed)
E-Visit for Gout Symptoms  We are sorry that you are not feeling well. We are here to help!  Based on what you shared with me it looks like you have a flare of your gout.  Gout is a form of arthritis. It can cause pain and swelling in the joints. At first, it tends to affect only 1 joint - most frequently the big toe. It happens in people who have too much uric acid in the blood. Uric acid is a chemical that is produced when the body breaks down certain foods. Uric acid can form sharp needle-like crystals that build up in the joints and cause pain. Uric acid crystals can also form inside the tubes that carry urine from the kidneys to the bladder. These crystals can turn into "kidney stones" that can cause pain and problems with the flow of urine. People with gout get sudden "flares" or attacks of severe pain, most often the big toe, ankle, or knee. Often the joint also turns red and swells. Usually, only 1 joint is affected, but some people have pain in more than 1 joint. Gout flares tend to happen more often during the night.  The pain from gout can be extreme. The pain and swelling are worst at the beginning of a gout flare. The symptoms then get better within a few days to weeks. It is not clear how the body "turns off" a gout flare.  Do not start any NEW preventative medicine until the gout has cleared completely. However, If you are already on Probenecid or Allopurinol for CHRONIC gout, you may continue taking this during an active flare up  I have prescribed Colchicine 0.6 mg tabs - Take 2 tabs immediately, then 1 tab twice per day for the duration of the flare up to a max of 7 days (but discontinue for stomach pains or diarrhea)    HOME CARE Losing weight can help relieve gout. It's not clear that following a specific diet plan will help with gout symptoms but eating a balanced diet can help improve your overall health. It can also help you lose weight, if you are overweight. In general, a  healthy diet includes plenty of fruits, vegetables, whole grains, and low-fat dairy products (labelled "low fat", skim, 2%). Avoid sugar sweetened drinks (including sodas, tea, juice and juice blends, coffee drinks and sports drinks) Limit alcohol to 1-2 drinks of beer, spirits or wine daily these can make gout flares worse. Some people with gout also have other health problems, such as heart disease, high blood pressure, kidney disease, or obesity. If you have any of these issues, it's important to work with your doctor to manage them. This can help improve your overall health and might also help with your gout.  GET HELP RIGHT AWAY IF: Your symptoms persist after you have completed your treatment plan You develop severe diarrhea You develop abnormal sensations  You develop vomiting,   You develop weakness  You develop abdominal pain  FOLLOW UP WITH YOUR PRIMARY PROVIDER IF: If your symptoms do not improve within 10 days  MAKE SURE YOU  Understand these instructions. Will watch your condition. Will get help right away if you are not doing well or get worse.  Thank you for choosing an e-visit.  Your e-visit answers were reviewed by a board certified advanced clinical practitioner to complete your personal care plan. Depending upon the condition, your plan could have included both over the counter or prescription medications.  Please review your  pharmacy choice. Make sure the pharmacy is open so you can pick up prescription now. If there is a problem, you may contact your provider through [employer] and have the prescription routed to another pharmacy.  Your safety is important to [place]. If you have drug allergies check your prescription carefully.   For the next 24 hours you can use MyChart to ask questions about today's visit, request a non-urgent call back, or ask for a work or school excuse. You will get an email in the next two days asking about your experience. I hope that your  e-visit has been valuable and will speed your recovery.  I provided 6 minutes of non face-to-face time during this encounter for chart review and documentation.

## 2021-03-17 NOTE — Progress Notes (Signed)
Duplicate

## 2021-03-23 ENCOUNTER — Telehealth: Payer: Self-pay | Admitting: Physician Assistant

## 2021-03-23 DIAGNOSIS — Z20818 Contact with and (suspected) exposure to other bacterial communicable diseases: Secondary | ICD-10-CM

## 2021-03-23 MED ORDER — AMOXICILLIN 500 MG PO TABS: 500.0000 mg | ORAL_TABLET | Freq: Two times a day (BID) | ORAL | 0 refills | Status: AC

## 2021-03-23 NOTE — Progress Notes (Signed)

## 2021-03-24 ENCOUNTER — Telehealth: Payer: Self-pay | Admitting: Nurse Practitioner

## 2021-03-24 DIAGNOSIS — M5441 Lumbago with sciatica, right side: Secondary | ICD-10-CM

## 2021-03-24 MED ORDER — CYCLOBENZAPRINE HCL 10 MG PO TABS: 10.0000 mg | ORAL_TABLET | Freq: Three times a day (TID) | ORAL | 1 refills | Status: DC | PRN

## 2021-03-24 MED ORDER — PREDNISONE 10 MG (21) PO TBPK
ORAL_TABLET | ORAL | 0 refills | Status: DC
Start: 2021-03-24 — End: 2021-05-31

## 2021-03-24 NOTE — Progress Notes (Signed)
We are sorry that you are not feeling well.  Here is how we plan to help!  Based on what you have shared with me it looks like you mostly have acute back pain.  Acute back pain is defined as musculoskeletal pain that can resolve in 1-3 weeks with conservative treatment.  I have prescribed prednisone 10mg  dose pack as directed for 6 days as well as Flexeril 10 mg every eight hours as needed which is a muscle relaxer  Some patients experience stomach irritation or in increased heartburn with anti-inflammatory drugs.  Please keep in mind that muscle relaxer's can cause fatigue and should not be taken while at work or driving.  Back pain is very common.  The pain often gets better over time.  The cause of back pain is usually not dangerous.  Most people can learn to manage their back pain on their own.  Directions for 6 day taper: Day 1: 2 tablets before breakfast, 1 after both lunch & dinner and 2 at bedtime Day 2: 1 tab before breakfast, 1 after both lunch & dinner and 2 at bedtime Day 3: 1 tab at each meal & 1 at bedtime Day 4: 1 tab at breakfast, 1 at lunch, 1 at bedtime Day 5: 1 tab at breakfast & 1 tab at bedtime Day 6: 1 tab at breakfast   Home Care Stay active.  Start with short walks on flat ground if you can.  Try to walk farther each day. Do not sit, drive or stand in one place for more than 30 minutes.  Do not stay in bed. Do not avoid exercise or work.  Activity can help your back heal faster. Be careful when you bend or lift an object.  Bend at your knees, keep the object close to you, and do not twist. Sleep on a firm mattress.  Lie on your side, and bend your knees.  If you lie on your back, put a pillow under your knees. Only take medicines as told by your doctor. Put ice on the injured area. Put ice in a plastic bag Place a towel between your skin and the bag Leave the ice on for 15-20 minutes, 3-4 times a day for the first 2-3 days. 210 After that, you can switch between ice  and heat packs. Ask your doctor about back exercises or massage. Avoid feeling anxious or stressed.  Find good ways to deal with stress, such as exercise.  Get Help Right Way If: Your pain does not go away with rest or medicine. Your pain does not go away in 1 week. You have new problems. You do not feel well. The pain spreads into your legs. You cannot control when you poop (bowel movement) or pee (urinate) You feel sick to your stomach (nauseous) or throw up (vomit) You have belly (abdominal) pain. You feel like you may pass out (faint). If you develop a fever.  Make Sure you: Understand these instructions. Will watch your condition Will get help right away if you are not doing well or get worse.  Your e-visit answers were reviewed by a board certified advanced clinical practitioner to complete your personal care plan.  Depending on the condition, your plan could have included both over the counter or prescription medications.  If there is a problem please reply  once you have received a response from your provider.  Your safety is important to .  If you have drug allergies check your prescription carefully.  You can use MyChart to ask questions about today's visit, request a non-urgent call back, or ask for a work or school excuse for 24 hours related to this e-Visit. If it has been greater than 24 hours you will need to follow up with your provider, or enter a new e-Visit to address those concerns.  You will get an e-mail in the next two days asking about your experience.  I hope that your e-visit has been valuable and will speed your recovery. Thank you for using e-visits.  5-10 minutes spent reviewing and documenting in chart.

## 2021-03-28 ENCOUNTER — Encounter (HOSPITAL_BASED_OUTPATIENT_CLINIC_OR_DEPARTMENT_OTHER): Payer: Self-pay

## 2021-03-28 ENCOUNTER — Other Ambulatory Visit: Payer: Self-pay

## 2021-03-28 DIAGNOSIS — R079 Chest pain, unspecified: Secondary | ICD-10-CM | POA: Insufficient documentation

## 2021-03-28 DIAGNOSIS — I10 Essential (primary) hypertension: Secondary | ICD-10-CM | POA: Insufficient documentation

## 2021-03-28 DIAGNOSIS — K219 Gastro-esophageal reflux disease without esophagitis: Secondary | ICD-10-CM | POA: Insufficient documentation

## 2021-03-28 NOTE — ED Triage Notes (Signed)
Patient here POV from [place] with CP for approximately 1 week.   Patient states the Pain became worse today and came to be evaluated. Pain is Mid-Chest and radiates to Left Chest and Left Arm.  NAD Noted during Triage. A&Ox4. GCS 15. No N/V/D. No SOB.

## 2021-03-29 ENCOUNTER — Emergency Department (HOSPITAL_BASED_OUTPATIENT_CLINIC_OR_DEPARTMENT_OTHER): Payer: Self-pay | Admitting: Radiology

## 2021-03-29 ENCOUNTER — Emergency Department (HOSPITAL_BASED_OUTPATIENT_CLINIC_OR_DEPARTMENT_OTHER)
Admission: EM | Admit: 2021-03-29 | Discharge: 2021-03-29 | Disposition: A | Discharge status: Home / Self Care | Payer: Self-pay | Attending: Emergency Medicine | Admitting: Emergency Medicine

## 2021-03-29 DIAGNOSIS — K219 Gastro-esophageal reflux disease without esophagitis: Secondary | ICD-10-CM

## 2021-03-29 DIAGNOSIS — R079 Chest pain, unspecified: Secondary | ICD-10-CM

## 2021-03-29 LAB — BASIC METABOLIC PANEL
Anion gap: 7 (ref 5–15)
BUN: 22 mg/dL — ABNORMAL HIGH (ref 6–20)
CO2: 27 mmol/L (ref 22–32)
Calcium: 9.2 mg/dL (ref 8.9–10.3)
Chloride: 106 mmol/L (ref 98–111)
Creatinine, Ser: 1.39 mg/dL — ABNORMAL HIGH (ref 0.61–1.24)
GFR, Estimated: >60 mL/min (ref >60)
Glucose, Bld: 120 mg/dL — ABNORMAL HIGH (ref 70–99)
Potassium: 3.7 mmol/L (ref 3.5–5.1)
Sodium: 140 mmol/L (ref 135–145)

## 2021-03-29 LAB — CBC
HCT: 45.7 % (ref 39.0–52.0)
Hemoglobin: 15.1 g/dL (ref 13.0–17.0)
MCH: 29.4 pg (ref 26.0–34.0)
MCHC: 33 g/dL (ref 30.0–36.0)
MCV: 89.1 fL (ref 80.0–100.0)
Platelets: 260 10*3/uL (ref 150–400)
RBC: 5.13 MIL/uL (ref 4.22–5.81)
RDW: 14.6 % (ref 11.5–15.5)
WBC: 9.5 10*3/uL (ref 4.0–10.5)
nRBC: 0 % (ref 0.0–0.2)

## 2021-03-29 LAB — TROPONIN I (HIGH SENSITIVITY): Troponin I (High Sensitivity): 8 ng/L (ref <18)

## 2021-03-29 MED ORDER — METHOCARBAMOL 500 MG PO TABS: 500.0000 mg | ORAL_TABLET | Freq: Two times a day (BID) | ORAL | 0 refills | Status: DC

## 2021-03-29 MED ORDER — SUCRALFATE 1 G PO TABS
1.0000 g | ORAL_TABLET | Freq: Four times a day (QID) | ORAL | 0 refills | Status: DC | PRN
Start: 2021-03-29 — End: 2023-07-16

## 2021-03-29 MED ORDER — LISINOPRIL-HYDROCHLOROTHIAZIDE 20-12.5 MG PO TABS: 1.0000 | ORAL_TABLET | Freq: Every day | ORAL | 0 refills | Status: DC

## 2021-03-29 MED ORDER — OMEPRAZOLE 20 MG PO CPDR: 20.0000 mg | DELAYED_RELEASE_CAPSULE | Freq: Every day | ORAL | 0 refills | Status: DC

## 2021-03-29 NOTE — Discharge Instructions (Addendum)
You were evaluated in the Emergency Department and after careful evaluation, we did not find any emergent condition requiring admission or further testing in the hospital.  Your exam/testing today was overall reassuring.  Please restart your Prilosec medication to help prevent acid reflux pain.  We also recommend the Carafate medication up to 4 times daily for immediate relief.  Please return to the Emergency Department if you experience any worsening of your condition.  Thank you for allowing [place] to be a part of your care.

## 2021-03-29 NOTE — ED Provider Notes (Signed)
[facility] Emergency Department Provider Note MRN:  [id number]  Arrival date & time: 03/29/21     Chief Complaint   Chest Pain   History of Present Illness   Benjamin Meyer is a 45 y.o. year-old male with a history of hypertension, obesity presenting to the ED with chief complaint of chest pain.  Location: Central chest and epigastrium Duration: 3 days Onset: Sudden Timing: Intermittent Description: Burning Severity: Mild Exacerbating/Alleviating Factors: Worse after meals, worse when lying flat Associated Symptoms: None Pertinent Negatives: No dizziness or diaphoresis, no nausea vomiting, no trouble breathing, no abdominal pain  Additional History: Coworker recently died, wanted to be checked out.  Review of Systems  A complete 10 system review of systems was obtained and all systems are negative except as noted in the HPI and PMH.   Patient's Health History    Past Medical History:  Diagnosis Date   Gout    Hypertension     History reviewed. No pertinent surgical history.  Family History  Problem Relation Age of Onset   Hypertension Mother    Diabetes Mother    Heart failure Father    Hypertension Father     Social History   Socioeconomic History   Marital status: Married    Spouse name: Not on file   Number of children: Not on file   Years of education: Not on file   Highest education level: Not on file  Occupational History   Not on file  Tobacco Use   Smoking status: Never   Smokeless tobacco: Never  Vaping Use   Vaping Use: Never used  Substance and Sexual Activity   Alcohol use: Yes    Comment: occasionally   Drug use: No   Sexual activity: Not on file  Other Topics Concern   Not on file  Social History Narrative   Not on file   Social Determinants of Health   Financial Resource Strain: Not on file  Food Insecurity: Not on file  Transportation Needs: Not on file  Physical Activity: Not on file  Stress: Not on  file  Social Connections: Not on file  Intimate Partner Violence: Not on file     Physical Exam   Vitals:   03/28/21 2353 03/29/21 0249  BP: (!) 176/98 137/90  Pulse: 87 90  Resp: 20 18  Temp: 97.8 F (36.6 C)   SpO2: 97% 96%    CONSTITUTIONAL: Well-appearing, NAD NEURO:  Alert and oriented x 3, no focal deficits EYES:  eyes equal and reactive ENT/NECK:  no LAD, no JVD CARDIO: Regular rate, well-perfused, normal S1 and S2 PULM:  CTAB no wheezing or rhonchi GI/GU:  normal bowel sounds, non-distended, non-tender MSK/SPINE:  No gross deformities, no edema SKIN:  no rash, atraumatic PSYCH:  Appropriate speech and behavior  *Additional and/or pertinent findings included in MDM below  Diagnostic and Interventional Summary    EKG Interpretation  Date/Time:  Tuesday March 28 2021 23:59:59 EDT Ventricular Rate:  88 PR Interval:  154 QRS Duration: 108 QT Interval:  386 QTC Calculation: 467 R Axis:   19 Text Interpretation: Normal sinus rhythm Nonspecific ST and T wave abnormality Abnormal ECG Confirmed by Kennis Carina [phone]) on 03/29/2021 2:59:55 AM       Labs Reviewed  BASIC METABOLIC PANEL - Abnormal; Notable for the following components:      Result Value   Glucose, Bld 120 (*)    BUN 22 (*)    Creatinine, Ser 1.39 (*)  All other components within normal limits  CBC  TROPONIN I (HIGH SENSITIVITY)  TROPONIN I (HIGH SENSITIVITY)    DG Chest 2 View  Final Result      Medications - No data to display   Procedures  /  Critical Care Procedures  ED Course and Medical Decision Making  I have reviewed the triage vital signs, the nursing notes, and pertinent available records from the EMR.  Listed above are laboratory and imaging tests that I personally ordered, reviewed, and interpreted and then considered in my medical decision making (see below for details).  Chest pain with qualities most consistent with a GI etiology.  EKG is reassuring, troponin is  negative.  Doubt cardiac etiology, nothing to suggest PE or dissection, patient is appropriate for discharge.       Elmer Sow. Pilar Plate, MD [facility] Emergency Medicine [facility] [email or web address]  Final Clinical Impressions(s) / ED Diagnoses     ICD-10-CM   1. Chest pain, unspecified type  R07.9     2. Gastroesophageal reflux disease without esophagitis  K21.9 omeprazole (PRILOSEC) 20 MG capsule      ED Discharge Orders          Ordered    methocarbamol (ROBAXIN) 500 MG tablet  2 times daily        03/29/21 0338    lisinopril-hydrochlorothiazide (ZESTORETIC) 20-12.5 MG tablet  Daily        03/29/21 0338    omeprazole (PRILOSEC) 20 MG capsule  Daily        03/29/21 0338    sucralfate (CARAFATE) 1 g tablet  4 times daily PRN        03/29/21 6144             Discharge Instructions Discussed with and Provided to Patient:    Discharge Instructions      You were evaluated in the Emergency Department and after careful evaluation, we did not find any emergent condition requiring admission or further testing in the hospital.  Your exam/testing today was overall reassuring.  Please restart your Prilosec medication to help prevent acid reflux pain.  We also recommend the Carafate medication up to 4 times daily for immediate relief.  Please return to the Emergency Department if you experience any worsening of your condition.  Thank you for allowing [place] to be a part of your care.        Sabas Sous, MD 03/29/21 [phone]

## 2021-05-23 ENCOUNTER — Emergency Department (HOSPITAL_COMMUNITY)
Admission: EM | Admit: 2021-05-23 | Discharge: 2021-05-24 | Disposition: A | Discharge status: Home / Self Care | Payer: Self-pay | Attending: Physician Assistant | Admitting: Physician Assistant

## 2021-05-23 ENCOUNTER — Telehealth: Payer: Self-pay | Admitting: Nurse Practitioner

## 2021-05-23 ENCOUNTER — Other Ambulatory Visit: Payer: Self-pay

## 2021-05-23 ENCOUNTER — Emergency Department (HOSPITAL_COMMUNITY): Payer: Self-pay

## 2021-05-23 DIAGNOSIS — Z20822 Contact with and (suspected) exposure to covid-19: Secondary | ICD-10-CM | POA: Insufficient documentation

## 2021-05-23 DIAGNOSIS — Z5321 Procedure and treatment not carried out due to patient leaving prior to being seen by health care provider: Secondary | ICD-10-CM | POA: Insufficient documentation

## 2021-05-23 DIAGNOSIS — R1084 Generalized abdominal pain: Secondary | ICD-10-CM | POA: Insufficient documentation

## 2021-05-23 DIAGNOSIS — R051 Acute cough: Secondary | ICD-10-CM

## 2021-05-23 DIAGNOSIS — R059 Cough, unspecified: Secondary | ICD-10-CM | POA: Insufficient documentation

## 2021-05-23 LAB — RESP PANEL BY RT-PCR (FLU A&B, COVID) ARPGX2
Influenza A by PCR: NEGATIVE
Influenza B by PCR: NEGATIVE
SARS Coronavirus 2 by RT PCR: POSITIVE — AB

## 2021-05-23 MED ORDER — NAPROXEN 500 MG PO TABS: 500.0000 mg | ORAL_TABLET | Freq: Two times a day (BID) | ORAL | 1 refills | Status: DC

## 2021-05-23 NOTE — ED Triage Notes (Signed)
Pt c/o nonproductive cough and pain to his left flank for the past few days. Pt denies d/v/d.

## 2021-05-23 NOTE — Progress Notes (Signed)
We are sorry that you are not feeling well.  Here is how we plan to help!  Based on your presentation I believe you most likely have A cough due to a virus.  This is called viral bronchitis and is best treated by rest, plenty of fluids and control of the cough.  You may use Ibuprofen or Tylenol as directed to help your symptoms.     In addition you may use A non-prescription cough medication called Mucinex DM: take 2 tablets every 12 hours.  Naprosyn 500mg  1 po bid - for back pain form cough  From your responses in the eVisit questionnaire you describe inflammation in the upper respiratory tract which is causing a significant cough.  This is commonly called Bronchitis and has four common causes:   Allergies Viral Infections Acid Reflux Bacterial Infection Allergies, viruses and acid reflux are treated by controlling symptoms or eliminating the cause. An example might be a cough caused by taking certain blood pressure medications. You stop the cough by changing the medication. Another example might be a cough caused by acid reflux. Controlling the reflux helps control the cough.  USE OF BRONCHODILATOR ("RESCUE") INHALERS: There is a risk from using your bronchodilator too frequently.  The risk is that over-reliance on a medication which only relaxes the muscles surrounding the breathing tubes can reduce the effectiveness of medications prescribed to reduce swelling and congestion of the tubes themselves.  Although you feel brief relief from the bronchodilator inhaler, your asthma may actually be worsening with the tubes becoming more swollen and filled with mucus.  This can delay other crucial treatments, such as oral steroid medications. If you need to use a bronchodilator inhaler daily, several times per day, you should discuss this with your provider.  There are probably better treatments that could be used to keep your asthma under control.     HOME CARE Only take medications as instructed by  your medical team. Complete the entire course of an antibiotic. Drink plenty of fluids and get plenty of rest. Avoid close contacts especially the very young and the elderly Cover your mouth if you cough or cough into your sleeve. Always remember to wash your hands A steam or ultrasonic humidifier can help congestion.   GET HELP RIGHT AWAY IF: You develop worsening fever. You become short of breath You cough up blood. Your symptoms persist after you have completed your treatment plan MAKE SURE YOU  Understand these instructions. Will watch your condition. Will get help right away if you are not doing well or get worse.    Thank you for choosing an e-visit.  Your e-visit answers were reviewed by a board certified advanced clinical practitioner to complete your personal care plan. Depending upon the condition, your plan could have included both over the counter or prescription medications.  Please review your pharmacy choice. Make sure the pharmacy is open so you can pick up prescription now. If there is a problem, you may contact your provider through and have the prescription routed to another pharmacy.  Your safety is important to [employer]. If you have drug allergies check your prescription carefully.   For the next 24 hours you can use MyChart to ask questions about today's visit, request a non-urgent call back, or ask for a work or school excuse. You will get an email in the next two days asking about your experience. I hope that your e-visit has been valuable and will speed your recovery.  5-10  minutes spent reviewing and documenting in chart.

## 2021-05-24 ENCOUNTER — Ambulatory Visit: Payer: Self-pay

## 2021-05-24 ENCOUNTER — Telehealth: Payer: Self-pay | Admitting: Nurse Practitioner

## 2021-05-24 ENCOUNTER — Ambulatory Visit (HOSPITAL_COMMUNITY): Payer: Self-pay

## 2021-05-24 DIAGNOSIS — U071 COVID-19: Secondary | ICD-10-CM

## 2021-05-24 NOTE — Progress Notes (Signed)
Since your COVID-19 symptoms started less than 5 days ago you may need a prescription for FDA-approved treatments (pills or IV therapy), which we cannot prescribe through an e-Visit. The best way for our providers to make a decision about which COVID treatment is right for you is through a Virtual Urgent Care Visit.  If you would like to discuss COVID therapy options with a provider, cancel this e-Visit and access a Virtual Urgent Care Visit from our Mychart menu.   You will not be charged for the e-Visit.    If you would like to discuss anti-viral treatment you can go back online and schedule an Urgent Care Visit that is through a video. We can only prescribe the anti-viral medication through a video visit.  If your symptoms have been present for over 5 days it is best to be seen in person for evaluation based on your medical background and increased risk factors associated with COVID.  Let us know if you have any questions.   Thank you Viviano Simas FNP-C

## 2021-05-29 ENCOUNTER — Telehealth: Payer: Self-pay | Admitting: Physician Assistant

## 2021-05-29 DIAGNOSIS — B001 Herpesviral vesicular dermatitis: Secondary | ICD-10-CM

## 2021-05-29 MED ORDER — VALACYCLOVIR HCL 1 G PO TABS: 2000.0000 mg | ORAL_TABLET | Freq: Two times a day (BID) | ORAL | 0 refills | Status: AC

## 2021-05-29 NOTE — Progress Notes (Signed)

## 2021-05-30 ENCOUNTER — Other Ambulatory Visit: Payer: Self-pay

## 2021-05-30 ENCOUNTER — Encounter (HOSPITAL_BASED_OUTPATIENT_CLINIC_OR_DEPARTMENT_OTHER): Payer: Self-pay

## 2021-05-30 ENCOUNTER — Emergency Department (HOSPITAL_BASED_OUTPATIENT_CLINIC_OR_DEPARTMENT_OTHER)
Admission: EM | Admit: 2021-05-30 | Discharge: 2021-05-31 | Disposition: A | Discharge status: Home / Self Care | Payer: Self-pay | Attending: Emergency Medicine | Admitting: Emergency Medicine

## 2021-05-30 ENCOUNTER — Telehealth: Payer: Self-pay | Admitting: Physician Assistant

## 2021-05-30 DIAGNOSIS — R748 Abnormal levels of other serum enzymes: Secondary | ICD-10-CM | POA: Insufficient documentation

## 2021-05-30 DIAGNOSIS — I10 Essential (primary) hypertension: Secondary | ICD-10-CM | POA: Insufficient documentation

## 2021-05-30 DIAGNOSIS — Z79899 Other long term (current) drug therapy: Secondary | ICD-10-CM | POA: Insufficient documentation

## 2021-05-30 DIAGNOSIS — R1012 Left upper quadrant pain: Secondary | ICD-10-CM

## 2021-05-30 DIAGNOSIS — R109 Unspecified abdominal pain: Secondary | ICD-10-CM

## 2021-05-30 DIAGNOSIS — N289 Disorder of kidney and ureter, unspecified: Secondary | ICD-10-CM | POA: Insufficient documentation

## 2021-05-30 NOTE — Progress Notes (Signed)
Based on what you shared with me, I feel your condition warrants further evaluation and I recommend that you be seen in a face to face visit.  We are unable to provide pain medication significant enough to help with pain from kidney stone. We can not also diagnose or manage those via e-visit or video on demand visit. As such you will need to be seen in person at closest Urgent Care or ER.    NOTE: There will be NO CHARGE for this eVisit   If you are having a true medical emergency please call 911.      For an urgent face to face visit, [facility] has six urgent care centers for your convenience:     [facility] at [facility] Directions [phone] [address] [place], [place] [address]    [facility]) Get Driving Directions [phone] [address] [place], [place] [address]  [facility]) Get Driving Directions [phone] [address] [place],  [place]  [address]  [facility] Urgent Care at [facility] Get Driving Directions [phone] [address] [place], [place] [address]   [facility] Urgent Care at [facility] Get Driving Directions  [phone] [address] [place], [place] [address]   [facility] Ur[facility] at [facility] Directions [phone] [address] [place], [place] [address]  Your MyChart E-visit questionnaire answers were reviewed by a board certified advanced clinical practitioner to complete your personal care plan based on your specific symptoms.  Thank you for using e-Visits.

## 2021-05-31 ENCOUNTER — Emergency Department (HOSPITAL_BASED_OUTPATIENT_CLINIC_OR_DEPARTMENT_OTHER): Payer: Self-pay

## 2021-05-31 ENCOUNTER — Encounter (HOSPITAL_BASED_OUTPATIENT_CLINIC_OR_DEPARTMENT_OTHER): Payer: Self-pay | Admitting: Radiology

## 2021-05-31 LAB — URINALYSIS, ROUTINE W REFLEX MICROSCOPIC
Bilirubin Urine: NEGATIVE
Glucose, UA: NEGATIVE mg/dL
Hgb urine dipstick: NEGATIVE
Ketones, ur: NEGATIVE mg/dL
Leukocytes,Ua: NEGATIVE
Nitrite: NEGATIVE
Protein, ur: 30 mg/dL — AB
Specific Gravity, Urine: >1.046 — ABNORMAL HIGH (ref 1.005–1.030)
pH: 5.5 (ref 5.0–8.0)

## 2021-05-31 LAB — COMPREHENSIVE METABOLIC PANEL
ALT: 29 U/L (ref 0–44)
AST: 22 U/L (ref 15–41)
Albumin: 3.9 g/dL (ref 3.5–5.0)
Alkaline Phosphatase: 62 U/L (ref 38–126)
Anion gap: 7 (ref 5–15)
BUN: 18 mg/dL (ref 6–20)
CO2: 28 mmol/L (ref 22–32)
Calcium: 9.6 mg/dL (ref 8.9–10.3)
Chloride: 104 mmol/L (ref 98–111)
Creatinine, Ser: 1.4 mg/dL — ABNORMAL HIGH (ref 0.61–1.24)
GFR, Estimated: >60 mL/min (ref >60)
Glucose, Bld: 109 mg/dL — ABNORMAL HIGH (ref 70–99)
Potassium: 3.9 mmol/L (ref 3.5–5.1)
Sodium: 139 mmol/L (ref 135–145)
Total Bilirubin: 0.3 mg/dL (ref 0.3–1.2)
Total Protein: 7.9 g/dL (ref 6.5–8.1)

## 2021-05-31 LAB — CBC WITH DIFFERENTIAL/PLATELET
Abs Immature Granulocytes: 0.02 10*3/uL (ref 0.00–0.07)
Basophils Absolute: 0 10*3/uL (ref 0.0–0.1)
Basophils Relative: 0 %
Eosinophils Absolute: 0.3 10*3/uL (ref 0.0–0.5)
Eosinophils Relative: 3 %
HCT: 48.9 % (ref 39.0–52.0)
Hemoglobin: 15.8 g/dL (ref 13.0–17.0)
Immature Granulocytes: 0 %
Lymphocytes Relative: 28 %
Lymphs Abs: 2.6 10*3/uL (ref 0.7–4.0)
MCH: 28.8 pg (ref 26.0–34.0)
MCHC: 32.3 g/dL (ref 30.0–36.0)
MCV: 89.2 fL (ref 80.0–100.0)
Monocytes Absolute: 0.8 10*3/uL (ref 0.1–1.0)
Monocytes Relative: 9 %
Neutro Abs: 5.5 10*3/uL (ref 1.7–7.7)
Neutrophils Relative %: 60 %
Platelets: 262 10*3/uL (ref 150–400)
RBC: 5.48 MIL/uL (ref 4.22–5.81)
RDW: 14.1 % (ref 11.5–15.5)
WBC: 9.3 10*3/uL (ref 4.0–10.5)
nRBC: 0 % (ref 0.0–0.2)

## 2021-05-31 LAB — LIPASE, BLOOD: Lipase: 54 U/L — ABNORMAL HIGH (ref 11–51)

## 2021-05-31 MED ORDER — IOHEXOL 300 MG/ML  SOLN
100.0000 mL | Freq: Once | INTRAMUSCULAR | Status: AC | PRN
  Administered 2021-05-31: 100 mL via INTRAVENOUS

## 2021-05-31 MED ORDER — PANTOPRAZOLE SODIUM 40 MG PO TBEC
40.0000 mg | DELAYED_RELEASE_TABLET | Freq: Once | ORAL | Status: AC
  Administered 2021-05-31: 40 mg via ORAL
  Filled 2021-05-31: qty 1

## 2021-05-31 NOTE — ED Provider Notes (Signed)
[facility] EMERGENCY DEPT Provider Note   CSN: [id number] Arrival date & time: 05/30/21  2329     History Chief Complaint  Patient presents with   Abdominal Pain    L sided flank/abd pain x1 week    Benjamin Meyer is a 45 y.o. male.  The history is provided by the patient.  Abdominal Pain He has history hypertension, gout and comes in complaining of pain in the left flank/upper abdomen for the last week.  It is a dull pain which gets sharper if he coughs.  He also states it feels like he has had excess gas.  He has tried taking over-the-counter NSAIDs without any benefit.  He denies radiation to the back or to the lower abdomen.  He denies fever, chills, sweats.  He denies nausea, vomiting, diarrhea.  He denies urinary urgency, frequency, tenesmus, dysuria.  Symptoms have been slowly worsening.   Past Medical History:  Diagnosis Date   Gout    Hypertension     There are no problems to display for this patient.   History reviewed. No pertinent surgical history.     Family History  Problem Relation Age of Onset   Hypertension Mother    Diabetes Mother    Heart failure Father    Hypertension Father     Social History   Tobacco Use   Smoking status: Never   Smokeless tobacco: Never  Vaping Use   Vaping Use: Never used  Substance Use Topics   Alcohol use: Yes    Comment: occasionally   Drug use: No    Home Medications Prior to Admission medications   Medication Sig Start Date End Date Taking? Authorizing Provider  amLODipine (NORVASC) 10 MG tablet Take 1 tablet (10 mg total) by mouth daily. 11/21/20   Benjiman Core, MD  colchicine 0.6 MG tablet Take 2 tabs immediately, then 1 tab twice per day for the duration of the flare up to a max of 7 days (but discontinue for stomach pains or diarrhea) 03/17/21   Margaretann Loveless, PA-C  cyclobenzaprine (FLEXERIL) 10 MG tablet Take 1 tablet (10 mg total) by mouth 3 (three) times daily as needed for  muscle spasms. 03/24/21   Daphine Deutscher, Mary-Margaret, FNP  indomethacin (INDOCIN) 50 MG capsule Take 1 capsule (50 mg total) by mouth 3 (three) times daily with meals. 03/17/21   Margaretann Loveless, PA-C  lisinopril-hydrochlorothiazide (ZESTORETIC) 20-12.5 MG tablet Take 1 tablet by mouth daily. 03/29/21   Sabas Sous, MD  meclizine (ANTIVERT) 25 MG tablet Take 1 tablet (25 mg total) by mouth 3 (three) times daily as needed for dizziness. 11/27/20   Waldon Merl, PA-C  methocarbamol (ROBAXIN) 500 MG tablet Take 1 tablet (500 mg total) by mouth 2 (two) times daily. 03/29/21   Sabas Sous, MD  naproxen (NAPROSYN) 500 MG tablet Take 1 tablet (500 mg total) by mouth 2 (two) times daily with a meal. 05/23/21   Daphine Deutscher, Mary-Margaret, FNP  omeprazole (PRILOSEC) 20 MG capsule Take 1 capsule (20 mg total) by mouth daily. 03/29/21   Sabas Sous, MD  predniSONE (STERAPRED UNI-PAK 21 TAB) 10 MG (21) TBPK tablet As directed x 6 days 03/24/21   Daphine Deutscher, Mary-Margaret, FNP  sucralfate (CARAFATE) 1 g tablet Take 1 tablet (1 g total) by mouth 4 (four) times daily as needed. 03/29/21   Sabas Sous, MD  tiZANidine (ZANAFLEX) 2 MG tablet Take 2 tablets (4 mg total) by mouth every 8 (eight) hours as  needed for muscle spasms. 09/30/20   Muthersbaugh, Dahlia Client, PA-C    Allergies    Patient has no known allergies.  Review of Systems   Review of Systems  Gastrointestinal:  Positive for abdominal pain.  All other systems reviewed and are negative.  Physical Exam Updated Vital Signs BP (!) 166/87 (BP Location: Left Arm)   Pulse 87   Temp 97.9 F (36.6 C) (Oral)   Resp 12   Ht 5\' 11"  (1.803 m)   Wt (!) 179.2 kg   SpO2 97%   BMI 55.09 kg/m   Physical Exam Vitals and nursing note reviewed.  Morbidly obese 45 year old male, resting comfortably and in no acute distress. Vital signs are significant for elevated blood pressure. Oxygen saturation is 97%, which is normal. Head is normocephalic and atraumatic.  PERRLA, EOMI. Oropharynx is clear. Neck is nontender and supple without adenopathy or JVD. Back is nontender and there is no CVA tenderness. Lungs are clear without rales, wheezes, or rhonchi. Chest is nontender. Heart has regular rate and rhythm without murmur. Abdomen is soft, flat, with tenderness fairly well localized to the left upper and lateral abdomen.  There is no rebound or guarding.  There are no masses or hepatosplenomegaly and peristalsis is normoactive. Extremities have no cyanosis or edema, full range of motion is present. Skin is warm and dry without rash. Neurologic: Mental status is normal, cranial nerves are intact, moves all extremities equally.  ED Results / Procedures / Treatments   Labs (all labs ordered are listed, but only abnormal results are displayed) Labs Reviewed  COMPREHENSIVE METABOLIC PANEL - Abnormal; Notable for the following components:      Result Value   Glucose, Bld 109 (*)    Creatinine, Ser 1.40 (*)    All other components within normal limits  LIPASE, BLOOD - Abnormal; Notable for the following components:   Lipase 54 (*)    All other components within normal limits  URINALYSIS, ROUTINE W REFLEX MICROSCOPIC - Abnormal; Notable for the following components:   Specific Gravity, Urine >1.046 (*)    Protein, ur 30 (*)    All other components within normal limits  CBC WITH DIFFERENTIAL/PLATELET    Radiology CT ABDOMEN PELVIS W CONTRAST  Result Date: 05/31/2021 CLINICAL DATA:  Acute onset of abdominal pain today, initial encounter EXAM: CT ABDOMEN AND PELVIS WITH CONTRAST TECHNIQUE: Multidetector CT imaging of the abdomen and pelvis was performed using the standard protocol following bolus administration of intravenous contrast. CONTRAST:  06/02/2021 OMNIPAQUE IOHEXOL 300 MG/ML  SOLN COMPARISON:  None. FINDINGS: Lower chest: No acute abnormality. Hepatobiliary: No focal liver abnormality is seen. No gallstones, gallbladder wall thickening, or biliary  dilatation. Pancreas: Unremarkable. No pancreatic ductal dilatation or surrounding inflammatory changes. Spleen: Normal in size without focal abnormality. Adrenals/Urinary Tract: Adrenal glands are within normal limits. Kidneys demonstrate a normal enhancement pattern bilaterally. Normal excretion is noted bilaterally. No renal calculi or obstructive changes are seen. The bladder is partially distended. Stomach/Bowel: Colon shows no obstructive or inflammatory changes. The appendix is within normal limits. Small bowel and stomach are unremarkable. Vascular/Lymphatic: No significant vascular findings are present. No enlarged abdominal or pelvic lymph nodes. Reproductive: Prostate is unremarkable. Other: No abdominal wall hernia or abnormality. No abdominopelvic ascites. Musculoskeletal: No acute or significant osseous findings. IMPRESSION: No evidence of diverticulitis. No acute abnormality is identified to correspond with the given clinical history. Electronically Signed   By: M.D.   On: 05/31/2021 02:29  Procedures Procedures   Medications Ordered in ED Medications  pantoprazole (PROTONIX) EC tablet 40 mg (has no administration in time range)  iohexol (OMNIPAQUE) 300 MG/ML solution 100 mL (100 mLs Intravenous Contrast Given 05/31/21 0207)    ED Course  I have reviewed the triage vital signs and the nursing notes.  Pertinent labs & imaging results that were available during my care of the patient were reviewed by me and considered in my medical decision making (see chart for details).   MDM Rules/Calculators/A&P                         Left upper quadrant abdominal pain of uncertain cause.  Most likely diverticulitis, consider peptic ulcer disease, urolithiasis, pyelonephritis.  Old records are reviewed, and he has no relevant past visits, no prior abdominal imaging.  We will check screening labs and send for CT of abdomen and pelvis.  CT scan shows no acute process.  Specifically,  no evidence of urolithiasis or diverticulitis.  Labs show stable renal insufficiency.  Mild elevation of lipase is not felt to be clinically significant.  Urinalysis is unremarkable.  Pain is felt most likely to have an upper GI origin and he is discharged with instructions to take over-the-counter omeprazole.  He is referred to gastroenterology for follow-up.  Return precautions discussed.  Also, he is advised not to take NSAIDs because of renal insufficiency.  Final Clinical Impression(s) / ED Diagnoses Final diagnoses:  Left upper quadrant abdominal pain  Renal insufficiency  Elevated lipase    Rx / DC Orders ED Discharge Orders     None        Dione Booze, MD 06/05/21 0530

## 2021-05-31 NOTE — Discharge Instructions (Signed)
Take omeprazole (Prilosec OTC) once a day.  You may take acetaminophen for additional pain relief.  Do not take ibuprofen or naproxen because they can hurt your kidney.  Return if your symptoms are getting worse.

## 2021-06-01 ENCOUNTER — Encounter (HOSPITAL_COMMUNITY): Payer: Self-pay | Admitting: Emergency Medicine

## 2021-06-01 ENCOUNTER — Telehealth: Payer: Self-pay | Admitting: Family Medicine

## 2021-06-01 ENCOUNTER — Other Ambulatory Visit: Payer: Self-pay

## 2021-06-01 ENCOUNTER — Ambulatory Visit (HOSPITAL_COMMUNITY)
Admission: EM | Admit: 2021-06-01 | Discharge: 2021-06-01 | Disposition: A | Discharge status: Home / Self Care | Payer: Self-pay | Attending: Urgent Care | Admitting: Urgent Care

## 2021-06-01 DIAGNOSIS — M549 Dorsalgia, unspecified: Secondary | ICD-10-CM

## 2021-06-01 DIAGNOSIS — U071 COVID-19: Secondary | ICD-10-CM

## 2021-06-01 DIAGNOSIS — K5900 Constipation, unspecified: Secondary | ICD-10-CM

## 2021-06-01 DIAGNOSIS — R748 Abnormal levels of other serum enzymes: Secondary | ICD-10-CM

## 2021-06-01 DIAGNOSIS — R1012 Left upper quadrant pain: Secondary | ICD-10-CM

## 2021-06-01 LAB — LIPASE, BLOOD: Lipase: 38 U/L (ref 11–51)

## 2021-06-01 MED ORDER — TIZANIDINE HCL 2 MG PO TABS: 4.0000 mg | ORAL_TABLET | Freq: Three times a day (TID) | ORAL | 0 refills | Status: DC | PRN

## 2021-06-01 MED ORDER — POLYETHYLENE GLYCOL 3350 17 G PO PACK: 17.0000 g | PACK | Freq: Every day | ORAL | 0 refills | Status: DC | PRN

## 2021-06-01 NOTE — ED Provider Notes (Signed)
[facility]   MRN: [id number] DOB: 1975-11-15  Subjective:   Benjamin Meyer is a 45 y.o. male presenting for persistent left-sided upper abdominal flank tenderness.  Symptoms have been ongoing for 1 week.  He was just seen at the hospital, had extensive work-up and was discharged.  Per patient he was advised to take Tylenol which has not helped.  He has felt more difficulty with constipation which he did not mention when he was evaluated at the hospital.  Reviewed labs with patient as below.  No current facility-administered medications for this encounter.  Current Outpatient Medications:    amLODipine (NORVASC) 10 MG tablet, Take 1 tablet (10 mg total) by mouth daily., Disp: 30 tablet, Rfl: 0   colchicine 0.6 MG tablet, Take 2 tabs immediately, then 1 tab twice per day for the duration of the flare up to a max of 7 days (but discontinue for stomach pains or diarrhea), Disp: 16 tablet, Rfl: 0   cyclobenzaprine (FLEXERIL) 10 MG tablet, Take 1 tablet (10 mg total) by mouth 3 (three) times daily as needed for muscle spasms., Disp: 30 tablet, Rfl: 1   indomethacin (INDOCIN) 50 MG capsule, Take 1 capsule (50 mg total) by mouth 3 (three) times daily with meals., Disp: 21 capsule, Rfl: 0   lisinopril-hydrochlorothiazide (ZESTORETIC) 20-12.5 MG tablet, Take 1 tablet by mouth daily., Disp: 30 tablet, Rfl: 0   meclizine (ANTIVERT) 25 MG tablet, Take 1 tablet (25 mg total) by mouth 3 (three) times daily as needed for dizziness., Disp: 30 tablet, Rfl: 0   methocarbamol (ROBAXIN) 500 MG tablet, Take 1 tablet (500 mg total) by mouth 2 (two) times daily., Disp: 20 tablet, Rfl: 0   omeprazole (PRILOSEC) 20 MG capsule, Take 1 capsule (20 mg total) by mouth daily., Disp: 30 capsule, Rfl: 0   sucralfate (CARAFATE) 1 g tablet, Take 1 tablet (1 g total) by mouth 4 (four) times daily as needed., Disp: 30 tablet, Rfl: 0   tiZANidine (ZANAFLEX) 2 MG tablet, Take 2 tablets (4 mg total) by mouth every  8 (eight) hours as needed for muscle spasms., Disp: 15 tablet, Rfl: 0   No Known Allergies  Past Medical History:  Diagnosis Date   Gout    Hypertension      History reviewed. No pertinent surgical history.  Family History  Problem Relation Age of Onset   Hypertension Mother    Diabetes Mother    Heart failure Father    Hypertension Father     Social History   Tobacco Use   Smoking status: Never   Smokeless tobacco: Never  Vaping Use   Vaping Use: Never used  Substance Use Topics   Alcohol use: Yes    Comment: occasionally   Drug use: No    ROS   Objective:   Vitals: BP (!) 160/88   Pulse 96   Temp 97.6 F (36.4 C) (Temporal)   Resp 18   SpO2 96%   Physical Exam Constitutional:      General: He is not in acute distress.    Appearance: Normal appearance. He is well-developed. He is obese. He is not ill-appearing, toxic-appearing or diaphoretic.  HENT:     Head: Normocephalic and atraumatic.     Right Ear: External ear normal.     Left Ear: External ear normal.     Nose: Nose normal.     Mouth/Throat:     Mouth: Mucous membranes are moist.     Pharynx:  Oropharynx is clear.  Eyes:     General: No scleral icterus.    Extraocular Movements: Extraocular movements intact.     Pupils: Pupils are equal, round, and reactive to light.  Cardiovascular:     Rate and Rhythm: Normal rate and regular rhythm.     Heart sounds: Normal heart sounds. No murmur heard.   No friction rub. No gallop.  Pulmonary:     Effort: Pulmonary effort is normal. No respiratory distress.     Breath sounds: Normal breath sounds. No stridor. No wheezing, rhonchi or rales.  Abdominal:     General: There is no distension.     Palpations: Abdomen is soft.     Tenderness: There is abdominal tenderness in the left upper quadrant. There is no right CVA tenderness, left CVA tenderness, guarding or rebound.  Neurological:     Mental Status: He is alert and oriented to person, place, and  time.  Psychiatric:        Mood and Affect: Mood normal.        Behavior: Behavior normal.        Thought Content: Thought content normal.    Recent Results (from the past 2160 hour(s))  Basic metabolic panel     Status: Abnormal   Collection Time: 03/29/21 12:01 AM  Result Value Ref Range   Sodium 140 135 - 145 mmol/L   Potassium 3.7 3.5 - 5.1 mmol/L   Chloride 106 98 - 111 mmol/L   CO2 27 22 - 32 mmol/L   Glucose, Bld 120 (H) 70 - 99 mg/dL    Comment: Glucose reference range applies only to samples taken after fasting for at least 8 hours.   BUN 22 (H) 6 - 20 mg/dL   Creatinine, Ser 7.12 (H) 0.61 - 1.24 mg/dL   Calcium 9.2 8.9 - 45.8 mg/dL   GFR, Estimated >09 >98 mL/min    Comment: (NOTE) Calculated using the CKD-EPI Creatinine Equation (2021)    Anion gap 7 5 - 15    Comment: Performed at [facility], [address], [place], [place] [address]  Troponin I (High Sensitivity)     Status: None   Collection Time: 03/29/21 12:01 AM  Result Value Ref Range   Troponin I (High Sensitivity) 8 <18 ng/L    Comment: (NOTE) Elevated high sensitivity troponin I (hsTnI) values and significant  changes across serial measurements may suggest ACS but many other  chronic and acute conditions are known to elevate hsTnI results.  Refer to the "Links" section for chest pain algorithms and additional  guidance. Performed at [facility], [address], [place], [place] [address]   CBC     Status: None   Collection Time: 03/29/21 12:06 AM  Result Value Ref Range   WBC 9.5 4.0 - 10.5 K/uL   RBC 5.13 4.22 - 5.81 MIL/uL   Hemoglobin 15.1 13.0 - 17.0 g/dL   HCT 67.3 41.9 - 37.9 %   MCV 89.1 80.0 - 100.0 fL   MCH 29.4 26.0 - 34.0 pg   MCHC 33.0 30.0 - 36.0 g/dL   RDW 02.4 09.7 - 35.3 %   Platelets 260 150 - 400 K/uL   nRBC 0.0 0.0 - 0.2 %    Comment: Performed at [facility], [address], [place], [place] [address]  Resp  Panel by RT-PCR (Flu A&B, Covid) Nasopharyngeal Swab     Status: Abnormal   Collection Time: 05/23/21  4:27 PM   Specimen:  Nasopharyngeal Swab; Nasopharyngeal(NP) swabs in vial transport medium  Result Value Ref Range   SARS Coronavirus 2 by RT PCR POSITIVE (A) NEGATIVE    Comment: RESULT CALLED TO, READ BACK BY AND VERIFIED WITH: CJ RIVERS RN.@[email or web address]  ON 11.8.22 BY TCALDWELL MT. (NOTE) SARS-CoV-2 target nucleic acids are DETECTED.  The SARS-CoV-2 RNA is generally detectable in upper respiratory specimens during the acute phase of infection. Positive results are indicative of the presence of the identified virus, but do not rule out bacterial infection or co-infection with other pathogens not detected by the test. Clinical correlation with patient history and other diagnostic information is necessary to determine patient infection status. The expected result is Negative.  Fact Sheet for Patients: BloggerCourse.com  Fact Sheet for Healthcare Providers: SeriousBroker.it  This test is not yet approved or cleared by the [place] FDA and  has been authorized for detection and/or diagnosis of SARS-CoV-2 by FDA under an Emergency Use Authorization (EUA).  This EUA will remain in effect (meaning thi s test can be used) for the duration of  the COVID-19 declaration under Section 564(b)(1) of the Act, 21 U.S.C. section 360bbb-3(b)(1), unless the authorization is terminated or revoked sooner.     Influenza A by PCR NEGATIVE NEGATIVE   Influenza B by PCR NEGATIVE NEGATIVE    Comment: (NOTE) The Xpert Xpress SARS-CoV-2/FLU/RSV plus assay is intended as an aid in the diagnosis of influenza from Nasopharyngeal swab specimens and should not be used as a sole basis for treatment. Nasal washings and aspirates are unacceptable for Xpert Xpress SARS-CoV-2/FLU/RSV testing.  Fact Sheet for  Patients: BloggerCourse.com  Fact Sheet for Healthcare Providers: SeriousBroker.it  This test is not yet approved or cleared by the [place] FDA and has been authorized for detection and/or diagnosis of SARS-CoV-2 by FDA under an Emergency Use Authorization (EUA). This EUA will remain in effect (meaning this test can be used) for the duration of the COVID-19 declaration under Section 564(b)(1) of the Act, 21 U.S.C. section 360bbb-3(b)(1), unless the authorization is terminated or revoked.  Performed at [facility], [address], [place], [place] [address]   Comprehensive metabolic panel     Status: Abnormal   Collection Time: 05/31/21  1:20 AM  Result Value Ref Range   Sodium 139 135 - 145 mmol/L   Potassium 3.9 3.5 - 5.1 mmol/L   Chloride 104 98 - 111 mmol/L   CO2 28 22 - 32 mmol/L   Glucose, Bld 109 (H) 70 - 99 mg/dL    Comment: Glucose reference range applies only to samples taken after fasting for at least 8 hours.   BUN 18 6 - 20 mg/dL   Creatinine, Ser 1.91 (H) 0.61 - 1.24 mg/dL   Calcium 9.6 8.9 - 47.8 mg/dL   Total Protein 7.9 6.5 - 8.1 g/dL   Albumin 3.9 3.5 - 5.0 g/dL   AST 22 15 - 41 U/L   ALT 29 0 - 44 U/L   Alkaline Phosphatase 62 38 - 126 U/L   Total Bilirubin 0.3 0.3 - 1.2 mg/dL   GFR, Estimated >29 >56 mL/min    Comment: (NOTE) Calculated using the CKD-EPI Creatinine Equation (2021)    Anion gap 7 5 - 15    Comment: Performed at [facility], [address] [place], [place] [address]  Lipase, blood     Status: Abnormal   Collection Time: 05/31/21  1:20 AM  Result Value Ref Range   Lipase 54 (H) 11 -  51 U/L    Comment: Performed at [facility], [address], [place], [place] [address]  CBC with Differential     Status: None   Collection Time: 05/31/21  1:20 AM  Result Value Ref Range   WBC 9.3 4.0 - 10.5 K/uL   RBC 5.48 4.22 - 5.81  MIL/uL   Hemoglobin 15.8 13.0 - 17.0 g/dL   HCT 59.5 63.8 - 75.6 %   MCV 89.2 80.0 - 100.0 fL   MCH 28.8 26.0 - 34.0 pg   MCHC 32.3 30.0 - 36.0 g/dL   RDW 43.3 29.5 - 18.8 %   Platelets 262 150 - 400 K/uL   nRBC 0.0 0.0 - 0.2 %   Neutrophils Relative % 60 %   Neutro Abs 5.5 1.7 - 7.7 K/uL   Lymphocytes Relative 28 %   Lymphs Abs 2.6 0.7 - 4.0 K/uL   Monocytes Relative 9 %   Monocytes Absolute 0.8 0.1 - 1.0 K/uL   Eosinophils Relative 3 %   Eosinophils Absolute 0.3 0.0 - 0.5 K/uL   Basophils Relative 0 %   Basophils Absolute 0.0 0.0 - 0.1 K/uL   Immature Granulocytes 0 %   Abs Immature Granulocytes 0.02 0.00 - 0.07 K/uL    Comment: Performed at [facility], [address], [place], [place] [address]  Urinalysis, Routine w reflex microscopic     Status: Abnormal   Collection Time: 05/31/21  4:57 AM  Result Value Ref Range   Color, Urine YELLOW YELLOW   APPearance CLEAR CLEAR   Specific Gravity, Urine >1.046 (H) 1.005 - 1.030   pH 5.5 5.0 - 8.0   Glucose, UA NEGATIVE NEGATIVE mg/dL   Hgb urine dipstick NEGATIVE NEGATIVE   Bilirubin Urine NEGATIVE NEGATIVE   Ketones, ur NEGATIVE NEGATIVE mg/dL   Protein, ur 30 (A) NEGATIVE mg/dL   Nitrite NEGATIVE NEGATIVE   Leukocytes,Ua NEGATIVE NEGATIVE   RBC / HPF 0-5 0 - 5 RBC/hpf   WBC, UA 0-5 0 - 5 WBC/hpf   Squamous Epithelial / LPF 0-5 0 - 5   Mucus PRESENT     Comment: Performed at [facility], [address], [place], [place] [address]  Lipase, blood     Status: None   Collection Time: 06/01/21  8:43 PM  Result Value Ref Range   Lipase 38 11 - 51 U/L    Comment: Performed at [facility], [address] [place], [place] [address]   DG Chest 2 View  Result Date: 05/23/2021 CLINICAL DATA:  Cough EXAM: CHEST - 2 VIEW COMPARISON:  September 2022 FINDINGS: The heart size and mediastinal contours are within normal limits. Both lungs are clear. No pleural effusion. No pneumothorax. The  visualized skeletal structures are unremarkable. IMPRESSION: No acute process in the chest. Electronically Signed   By: Guadlupe Spanish M.D.   On: 05/23/2021 16:51   DG Chest 2 View  Result Date: 03/29/2021 CLINICAL DATA:  Chest pain EXAM: CHEST - 2 VIEW COMPARISON:  None. FINDINGS: The heart size and mediastinal contours are within normal limits. Both lungs are clear. The visualized skeletal structures are unremarkable. IMPRESSION: No active cardiopulmonary disease. Electronically Signed   By: Deatra Robinson M.D.   On: 03/29/2021 01:05   CT ABDOMEN PELVIS W CONTRAST  Result Date: 05/31/2021 CLINICAL DATA:  Acute onset of abdominal pain today, initial encounter EXAM: CT ABDOMEN AND PELVIS WITH CONTRAST TECHNIQUE: Multidetector CT imaging of the abdomen and pelvis was performed using  the standard protocol following bolus administration of intravenous contrast. CONTRAST:  OMNIPAQUE IOHEXOL 300 MG/ML  SOLN COMPARISON:  None. FINDINGS: Lower chest: No acute abnormality. Hepatobiliary: No focal liver abnormality is seen. No gallstones, gallbladder wall thickening, or biliary dilatation. Pancreas: Unremarkable. No pancreatic ductal dilatation or surrounding inflammatory changes. Spleen: Normal in size without focal abnormality. Adrenals/Urinary Tract: Adrenal glands are within normal limits. Kidneys demonstrate a normal enhancement pattern bilaterally. Normal excretion is noted bilaterally. No renal calculi or obstructive changes are seen. The bladder is partially distended. Stomach/Bowel: Colon shows no obstructive or inflammatory changes. The appendix is within normal limits. Small bowel and stomach are unremarkable. Vascular/Lymphatic: No significant vascular findings are present. No enlarged abdominal or pelvic lymph nodes. Reproductive: Prostate is unremarkable. Other: No abdominal wall hernia or abnormality. No abdominopelvic ascites. Musculoskeletal: No acute or significant osseous findings.  IMPRESSION: No evidence of diverticulitis. No acute abnormality is identified to correspond with the given clinical history. Electronically Signed   By: Alcide Clever M.D.   On: 05/31/2021 02:29     Assessment and Plan :   PDMP not reviewed this encounter.  1. Left upper quadrant abdominal pain   2. Constipation, unspecified constipation type   3. COVID-19 virus infection   4. Increased serum lipase level     The only abnormal result the patient had was an elevated lipase which was borderline.  He is not a heavy drinker.  No history of diabetes, gallbladder disease.  Recommended to recheck.  He requested a muscle relaxant which I will offer him.  CT scan was negative for obstructive uropathy, acute intra-abdominal process including pancreatitis.  We will recheck a lipase level and redirect back to the hospital if this is continuing to increase.  Do not suspect that this is related to his recent COVID-19 infection. Counseled patient on potential for adverse effects with medications prescribed/recommended today, ER and return-to-clinic precautions discussed, patient verbalized understanding.    Wallis Bamberg, PA-C 06/05/21 [phone]

## 2021-06-01 NOTE — Progress Notes (Signed)
  In need of pain medication to help IBS  In person recommended and policy reviewed with pt

## 2021-06-01 NOTE — ED Triage Notes (Signed)
Pt is present today with abdominal pain and slight constipation. Pt sx started one week ago.

## 2021-06-07 ENCOUNTER — Telehealth: Payer: Self-pay | Admitting: Family

## 2021-06-07 DIAGNOSIS — M109 Gout, unspecified: Secondary | ICD-10-CM

## 2021-06-07 MED ORDER — INDOMETHACIN 50 MG PO CAPS: 50.0000 mg | ORAL_CAPSULE | Freq: Three times a day (TID) | ORAL | 0 refills | Status: DC

## 2021-06-07 MED ORDER — COLCHICINE 0.6 MG PO TABS: ORAL_TABLET | ORAL | 0 refills | Status: DC

## 2021-06-07 NOTE — Progress Notes (Signed)
E-Visit for Gout Symptoms  We are sorry that you are not feeling well. We are here to help!  Based on what you shared with me it looks like you have a flare of your gout.  Gout is a form of arthritis. It can cause pain and swelling in the joints. At first, it tends to affect only 1 joint - most frequently the big toe. It happens in people who have too much uric acid in the blood. Uric acid is a chemical that is produced when the body breaks down certain foods. Uric acid can form sharp needle-like crystals that build up in the joints and cause pain. Uric acid crystals can also form inside the tubes that carry urine from the kidneys to the bladder. These crystals can turn into "kidney stones" that can cause pain and problems with the flow of urine. People with gout get sudden "flares" or attacks of severe pain, most often the big toe, ankle, or knee. Often the joint also turns red and swells. Usually, only 1 joint is affected, but some people have pain in more than 1 joint. Gout flares tend to happen more often during the night.  The pain from gout can be extreme. The pain and swelling are worst at the beginning of a gout flare. The symptoms then get better within a few days to weeks. It is not clear how the body "turns off" a gout flare.  Do not start any NEW preventative medicine until the gout has cleared completely. However, If you are already on Probenecid or Allopurinol for CHRONIC gout, you may continue taking this during an active flare up  I have prescribed Indomethacin 50mg  three times daily for moderate to severe pain for no more than 7 days and I have prescribed Colchicine 0.6 mg tabs - Take 2 tabs immediately, then 1 tab twice per day for the duration of the flare up to a max of 7 days (but discontinue for stomach pains or diarrhea)    HOME CARE Losing weight can help relieve gout. It's not clear that following a specific diet plan will help with gout symptoms but eating a balanced diet can  help improve your overall health. It can also help you lose weight, if you are overweight. In general, a healthy diet includes plenty of fruits, vegetables, whole grains, and low-fat dairy products (labelled "low fat", skim, 2%). Avoid sugar sweetened drinks (including sodas, tea, juice and juice blends, coffee drinks and sports drinks) Limit alcohol to 1-2 drinks of beer, spirits or wine daily these can make gout flares worse. Some people with gout also have other health problems, such as heart disease, high blood pressure, kidney disease, or obesity. If you have any of these issues, it's important to work with your doctor to manage them. This can help improve your overall health and might also help with your gout.  GET HELP RIGHT AWAY IF: Your symptoms persist after you have completed your treatment plan You develop severe diarrhea You develop abnormal sensations  You develop vomiting,   You develop weakness  You develop abdominal pain  FOLLOW UP WITH YOUR PRIMARY PROVIDER IF: If your symptoms do not improve within 10 days  MAKE SURE YOU  Understand these instructions. Will watch your condition. Will get help right away if you are not doing well or get worse.  Thank you for choosing an e-visit.  Your e-visit answers were reviewed by a board certified advanced clinical practitioner to complete your personal care plan.  Depending upon the condition, your plan could have included both over the counter or prescription medications.  Please review your pharmacy choice. Make sure the pharmacy is open so you can pick up prescription now. If there is a problem, you may contact your provider through MyChart messaging and have the prescription routed to another pharmacy.  Your safety is important to us. If you have drug allergies check your prescription carefully.   For the next 24 hours you can use MyChart to ask questions about today's visit, request a non-urgent call back, or ask for a work or  school excuse. You will get an email in the next two days asking about your experience. I hope that your e-visit has been valuable and will speed your recovery.   Approximately 5 minutes was spent documenting and reviewing patient's chart.   

## 2021-06-14 ENCOUNTER — Other Ambulatory Visit: Payer: Self-pay | Admitting: Family

## 2021-06-14 DIAGNOSIS — M109 Gout, unspecified: Secondary | ICD-10-CM

## 2021-07-02 ENCOUNTER — Telehealth: Payer: Self-pay | Admitting: Family

## 2021-07-02 DIAGNOSIS — R21 Rash and other nonspecific skin eruption: Secondary | ICD-10-CM

## 2021-07-02 MED ORDER — TRIAMCINOLONE ACETONIDE 0.5 % EX OINT: 1.0000 "application " | TOPICAL_OINTMENT | Freq: Two times a day (BID) | CUTANEOUS | 0 refills | Status: DC

## 2021-07-02 NOTE — Progress Notes (Signed)
E Visit for Rash  We are sorry that you are not feeling well. Here is how we plan to help!  It appears as a bite. I have sent a prescription of kenalog cream, a steroid cream, you can apply twice a day.    HOME CARE:  Take cool showers and avoid direct sunlight. Apply cool compress or wet dressings. Take a bath in an oatmeal bath.  Sprinkle content of one Aveeno packet under running faucet with comfortably warm water.  Bathe for 15-20 minutes, 1-2 times daily.  Pat dry with a towel. Do not rub the rash. Use hydrocortisone cream. Take an antihistamine like Benadryl for widespread rashes that itch.  The adult dose of Benadryl is 25-50 mg by mouth 4 times daily. Caution:  This type of medication may cause sleepiness.  Do not drink alcohol, drive, or operate dangerous machinery while taking antihistamines.  Do not take these medications if you have prostate enlargement.  Read package instructions thoroughly on all medications that you take.  GET HELP RIGHT AWAY IF:  Symptoms don't go away after treatment. Severe itching that persists. If you rash spreads or swells. If you rash begins to smell. If it blisters and opens or develops a yellow-brown crust. You develop a fever. You have a sore throat. You become short of breath.  MAKE SURE YOU:  Understand these instructions. Will watch your condition. Will get help right away if you are not doing well or get worse.  Thank you for choosing an e-visit.  Your e-visit answers were reviewed by a board certified advanced clinical practitioner to complete your personal care plan. Depending upon the condition, your plan could have included both over the counter or prescription medications.  Please review your pharmacy choice. Make sure the pharmacy is open so you can pick up prescription now. If there is a problem, you may contact your provider through [employer] and have the prescription routed to another pharmacy.  Your safety is  important to [place]. If you have drug allergies check your prescription carefully.   For the next 24 hours you can use MyChart to ask questions about today's visit, request a non-urgent call back, or ask for a work or school excuse. You will get an email in the next two days asking about your experience. I hope that your e-visit has been valuable and will speed your recovery.  Approximately 5 minutes was spent documenting and reviewing patient's chart.

## 2021-07-25 ENCOUNTER — Telehealth: Payer: Self-pay | Admitting: Physician Assistant

## 2021-07-25 DIAGNOSIS — J069 Acute upper respiratory infection, unspecified: Secondary | ICD-10-CM

## 2021-07-25 MED ORDER — BENZONATATE 100 MG PO CAPS: 100.0000 mg | ORAL_CAPSULE | Freq: Three times a day (TID) | ORAL | 0 refills | Status: DC | PRN

## 2021-07-25 NOTE — Progress Notes (Signed)
I have spent 5 minutes in review of e-visit questionnaire, review and updating patient chart, medical decision making and response to patient.   Becky Cody Briana Newman, PA-C    

## 2021-07-25 NOTE — Progress Notes (Signed)
We are sorry that you are not feeling well.  Here is how we plan to help! ° °Based on your presentation I believe you most likely have A cough due to a virus.  This is called viral bronchitis and is best treated by rest, plenty of fluids and control of the cough.  You may use Ibuprofen or Tylenol as directed to help your symptoms.   °  °In addition you may use A prescription cough medication called Tessalon Perles 100mg. You may take 1-2 capsules every 8 hours as needed for your cough. ° ° °From your responses in the eVisit questionnaire you describe inflammation in the upper respiratory tract which is causing a significant cough.  This is commonly called Bronchitis and has four common causes:   °Allergies °Viral Infections °Acid Reflux °Bacterial Infection °Allergies, viruses and acid reflux are treated by controlling symptoms or eliminating the cause. An example might be a cough caused by taking certain blood pressure medications. You stop the cough by changing the medication. Another example might be a cough caused by acid reflux. Controlling the reflux helps control the cough. ° °USE OF BRONCHODILATOR ("RESCUE") INHALERS: °There is a risk from using your bronchodilator too frequently.  The risk is that over-reliance on a medication which only relaxes the muscles surrounding the breathing tubes can reduce the effectiveness of medications prescribed to reduce swelling and congestion of the tubes themselves.  Although you feel brief relief from the bronchodilator inhaler, your asthma may actually be worsening with the tubes becoming more swollen and filled with mucus.  This can delay other crucial treatments, such as oral steroid medications. If you need to use a bronchodilator inhaler daily, several times per day, you should discuss this with your provider.  There are probably better treatments that could be used to keep your asthma under control.  °   °HOME CARE °Only take medications as instructed by your  medical team. °Complete the entire course of an antibiotic. °Drink plenty of fluids and get plenty of rest. °Avoid close contacts especially the very young and the elderly °Cover your mouth if you cough or cough into your sleeve. °Always remember to wash your hands °A steam or ultrasonic humidifier can help congestion.  ° °GET HELP RIGHT AWAY IF: °You develop worsening fever. °You become short of breath °You cough up blood. °Your symptoms persist after you have completed your treatment plan °MAKE SURE YOU  °Understand these instructions. °Will watch your condition. °Will get help right away if you are not doing well or get worse. °  ° °Thank you for choosing an e-visit. ° °Your e-visit answers were reviewed by a board certified advanced clinical practitioner to complete your personal care plan. Depending upon the condition, your plan could have included both over the counter or prescription medications. ° °Please review your pharmacy choice. Make sure the pharmacy is open so you can pick up prescription now. If there is a problem, you may contact your provider through MyChart messaging and have the prescription routed to another pharmacy.  Your safety is important to us. If you have drug allergies check your prescription carefully.  ° °For the next 24 hours you can use MyChart to ask questions about today's visit, request a non-urgent call back, or ask for a work or school excuse. °You will get an email in the next two days asking about your experience. I hope that your e-visit has been valuable and will speed your recovery. ° °

## 2021-08-30 ENCOUNTER — Telehealth: Payer: Self-pay | Admitting: Physician Assistant

## 2021-08-30 DIAGNOSIS — M109 Gout, unspecified: Secondary | ICD-10-CM

## 2021-08-30 MED ORDER — INDOMETHACIN 50 MG PO CAPS: 50.0000 mg | ORAL_CAPSULE | Freq: Three times a day (TID) | ORAL | 0 refills | Status: DC

## 2021-08-30 NOTE — Progress Notes (Signed)
E-Visit for Gout Symptoms  We are sorry that you are not feeling well. We are here to help!  Based on what you shared with me it looks like you have a flare of your gout.  Gout is a form of arthritis. It can cause pain and swelling in the joints. At first, it tends to affect only 1 joint - most frequently the big toe. It happens in people who have too much uric acid in the blood. Uric acid is a chemical that is produced when the body breaks down certain foods. Uric acid can form sharp needle-like crystals that build up in the joints and cause pain. Uric acid crystals can also form inside the tubes that carry urine from the kidneys to the bladder. These crystals can turn into "kidney stones" that can cause pain and problems with the flow of urine. People with gout get sudden "flares" or attacks of severe pain, most often the big toe, ankle, or knee. Often the joint also turns red and swells. Usually, only 1 joint is affected, but some people have pain in more than 1 joint. Gout flares tend to happen more often during the night.  The pain from gout can be extreme. The pain and swelling are worst at the beginning of a gout flare. The symptoms then get better within a few days to weeks. It is not clear how the body "turns off" a gout flare.  Do not start any NEW preventative medicine until the gout has cleared completely. However, If you are already on Probenecid or Allopurinol for CHRONIC gout, you may continue taking this during an active flare up  I have prescribed Indomethacin 50mg  three times daily for moderate to severe pain for no more than 7 days. I would not recommend [place] doing this alongside colchicine giving your last renal (kidney) function testing as they both leave the body through the kidneys and can cause further impairment, especially taken together.    HOME CARE Losing weight can help relieve gout. It's not clear that following a specific diet plan will help with gout symptoms but eating  a balanced diet can help improve your overall health. It can also help you lose weight, if you are overweight. In general, a healthy diet includes plenty of fruits, vegetables, whole grains, and low-fat dairy products (labelled low fat, skim, 2%). Avoid sugar sweetened drinks (including sodas, tea, juice and juice blends, coffee drinks and sports drinks) Limit alcohol to 1-2 drinks of beer, spirits or wine daily these can make gout flares worse. Some people with gout also have other health problems, such as heart disease, high blood pressure, kidney disease, or obesity. If you have any of these issues, it's important to work with your doctor to manage them. This can help improve your overall health and might also help with your gout.  GET HELP RIGHT AWAY IF: Your symptoms persist after you have completed your treatment plan You develop severe diarrhea You develop abnormal sensations  You develop vomiting,   You develop weakness  You develop abdominal pain  FOLLOW UP WITH YOUR PRIMARY PROVIDER IF: If your symptoms do not improve within 10 days  MAKE SURE YOU  Understand these instructions. Will watch your condition. Will get help right away if you are not doing well or get worse.  Thank you for choosing an e-visit.  Your e-visit answers were reviewed by a board certified advanced clinical practitioner to complete your personal care plan. Depending upon the condition, your plan  could have included both over the counter or prescription medications.  Please review your pharmacy choice. Make sure the pharmacy is open so you can pick up prescription now. If there is a problem, you may contact your provider through [employer] and have the prescription routed to another pharmacy.  Your safety is important to [place]. If you have drug allergies check your prescription carefully.   For the next 24 hours you can use MyChart to ask questions about today's visit, request a non-urgent call back, or  ask for a work or school excuse. You will get an email in the next two days asking about your experience. I hope that your e-visit has been valuable and will speed your recovery.

## 2021-08-30 NOTE — Progress Notes (Signed)
I have spent 5 minutes in review of e-visit questionnaire, review and updating patient chart, medical decision making and response to patient.   Tyke Cody Mordechai Matuszak, PA-C    

## 2021-09-07 ENCOUNTER — Telehealth: Payer: Self-pay | Admitting: Nurse Practitioner

## 2021-09-07 DIAGNOSIS — J069 Acute upper respiratory infection, unspecified: Secondary | ICD-10-CM

## 2021-09-07 MED ORDER — BENZONATATE 100 MG PO CAPS: 100.0000 mg | ORAL_CAPSULE | Freq: Three times a day (TID) | ORAL | 0 refills | Status: DC | PRN

## 2021-09-07 NOTE — Progress Notes (Signed)
We are sorry that you are not feeling well.  Here is how we plan to help!  Based on your presentation I believe you most likely have A cough due to a virus.  This is called viral bronchitis and is best treated by rest, plenty of fluids and control of the cough.  You may use Ibuprofen or Tylenol as directed to help your symptoms.     In addition you may use A prescription cough medication called Tessalon Perles 100mg. You may take 1-2 capsules every 8 hours as needed for your cough.   From your responses in the eVisit questionnaire you describe inflammation in the upper respiratory tract which is causing a significant cough.  This is commonly called Bronchitis and has four common causes:   Allergies Viral Infections Acid Reflux Bacterial Infection Allergies, viruses and acid reflux are treated by controlling symptoms or eliminating the cause. An example might be a cough caused by taking certain blood pressure medications. You stop the cough by changing the medication. Another example might be a cough caused by acid reflux. Controlling the reflux helps control the cough.  USE OF BRONCHODILATOR ("RESCUE") INHALERS: There is a risk from using your bronchodilator too frequently.  The risk is that over-reliance on a medication which only relaxes the muscles surrounding the breathing tubes can reduce the effectiveness of medications prescribed to reduce swelling and congestion of the tubes themselves.  Although you feel brief relief from the bronchodilator inhaler, your asthma may actually be worsening with the tubes becoming more swollen and filled with mucus.  This can delay other crucial treatments, such as oral steroid medications. If you need to use a bronchodilator inhaler daily, several times per day, you should discuss this with your provider.  There are probably better treatments that could be used to keep your asthma under control.     HOME CARE Only take medications as instructed by your  medical team. Complete the entire course of an antibiotic. Drink plenty of fluids and get plenty of rest. Avoid close contacts especially the very young and the elderly Cover your mouth if you cough or cough into your sleeve. Always remember to wash your hands A steam or ultrasonic humidifier can help congestion.   GET HELP RIGHT AWAY IF: You develop worsening fever. You become short of breath You cough up blood. Your symptoms persist after you have completed your treatment plan MAKE SURE YOU  Understand these instructions. Will watch your condition. Will get help right away if you are not doing well or get worse.    Thank you for choosing an e-visit.  Your e-visit answers were reviewed by a board certified advanced clinical practitioner to complete your personal care plan. Depending upon the condition, your plan could have included both over the counter or prescription medications.  Please review your pharmacy choice. Make sure the pharmacy is open so you can pick up prescription now. If there is a problem, you may contact your provider through MyChart messaging and have the prescription routed to another pharmacy.  Your safety is important to us. If you have drug allergies check your prescription carefully.   For the next 24 hours you can use MyChart to ask questions about today's visit, request a non-urgent call back, or ask for a work or school excuse. You will get an email in the next two days asking about your experience. I hope that your e-visit has been valuable and will speed your recovery.  5-10 minutes spent reviewing   and documenting in chart.  

## 2021-10-04 ENCOUNTER — Telehealth: Payer: Self-pay | Admitting: Physician Assistant

## 2021-10-04 DIAGNOSIS — J029 Acute pharyngitis, unspecified: Secondary | ICD-10-CM

## 2021-10-04 DIAGNOSIS — Z20818 Contact with and (suspected) exposure to other bacterial communicable diseases: Secondary | ICD-10-CM

## 2021-10-04 MED ORDER — AMOXICILLIN 500 MG PO TABS: 500.0000 mg | ORAL_TABLET | Freq: Two times a day (BID) | ORAL | 0 refills | Status: AC

## 2021-10-04 NOTE — Progress Notes (Signed)

## 2021-10-04 NOTE — Progress Notes (Signed)
I have spent 5 minutes in review of e-visit questionnaire, review and updating patient chart, medical decision making and response to patient.   Tyreke Cody Sage Kopera, PA-C    

## 2021-11-04 ENCOUNTER — Telehealth: Payer: Self-pay | Admitting: Nurse Practitioner

## 2021-11-04 DIAGNOSIS — B001 Herpesviral vesicular dermatitis: Secondary | ICD-10-CM

## 2021-11-04 MED ORDER — VALACYCLOVIR HCL 1 G PO TABS: 2000.0000 mg | ORAL_TABLET | Freq: Two times a day (BID) | ORAL | 0 refills | Status: AC

## 2021-11-04 NOTE — Progress Notes (Signed)

## 2021-11-04 NOTE — Progress Notes (Signed)
I have spent 5 minutes in review of e-visit questionnaire, review and updating patient chart, medical decision making and response to patient.  ° °Lynkin Saini W Nichoel Digiulio, NP ° °  °

## 2021-12-21 ENCOUNTER — Telehealth: Payer: Self-pay | Admitting: Physician Assistant

## 2021-12-21 DIAGNOSIS — S025XXA Fracture of tooth (traumatic), initial encounter for closed fracture: Secondary | ICD-10-CM

## 2021-12-21 MED ORDER — NAPROXEN 500 MG PO TABS: 500.0000 mg | ORAL_TABLET | Freq: Two times a day (BID) | ORAL | 0 refills | Status: DC

## 2021-12-21 NOTE — Progress Notes (Signed)
I have spent 5 minutes in review of e-visit questionnaire, review and updating patient chart, medical decision making and response to patient.   Franz Cody Yaritsa Savarino, PA-C    

## 2021-12-21 NOTE — Progress Notes (Signed)

## 2021-12-30 IMAGING — CT CT ABD-PELV W/ CM
2 of 5 series · 17 of 46 positions shown, 19 images · IV contrast (APPLIED)
Comparison: None.

CLINICAL DATA: Acute onset of abdominal pain today, initial
encounter

EXAM:
CT ABDOMEN AND PELVIS WITH CONTRAST
TECHNIQUE: Multidetector CT imaging of the abdomen and pelvis was performed
using the standard protocol following bolus administration of
intravenous contrast.
CONTRAST:  100mL OMNIPAQUE IOHEXOL 300 MG/ML  SOLN

[Series 3: abd pel w · axial · 0.98mm/px · z∈[+739,+1184]mm · 14 of 101 slices shown, 16 images]
[im 6/101  soft-tissue]
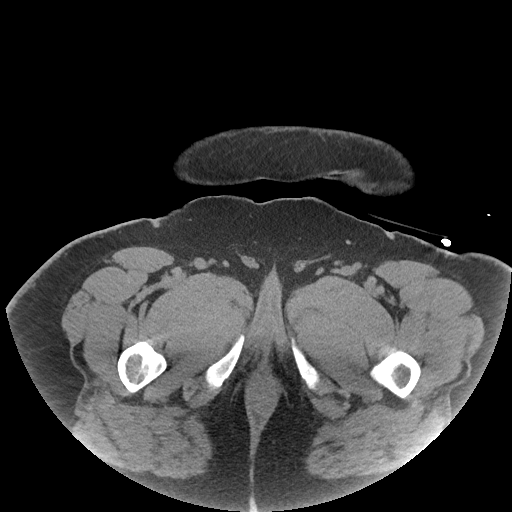
[im 6/101  bone]
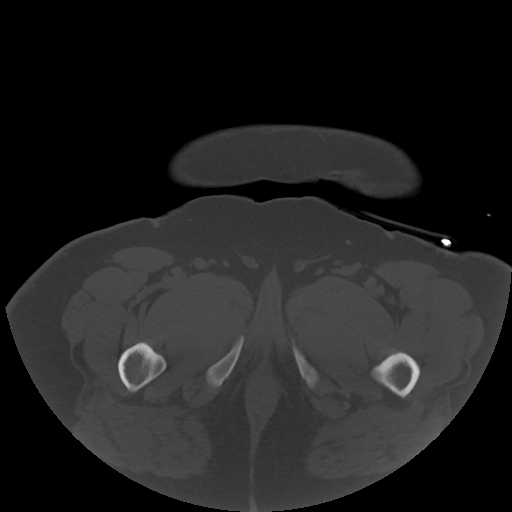
[im 12/101  soft-tissue]
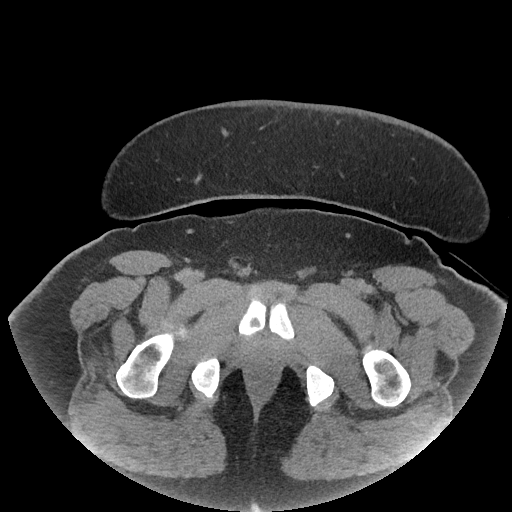
[im 18/101  soft-tissue]
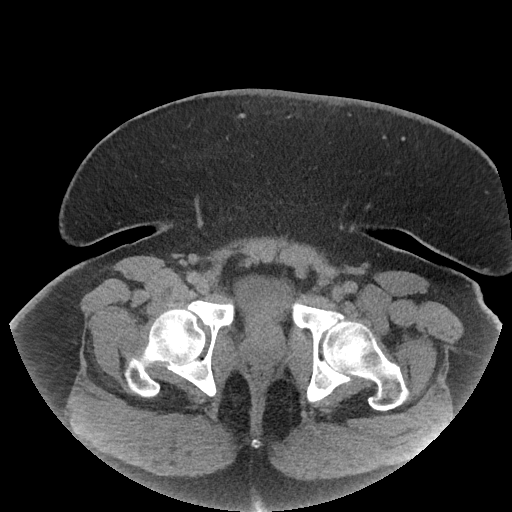
[im 30/101  soft-tissue]
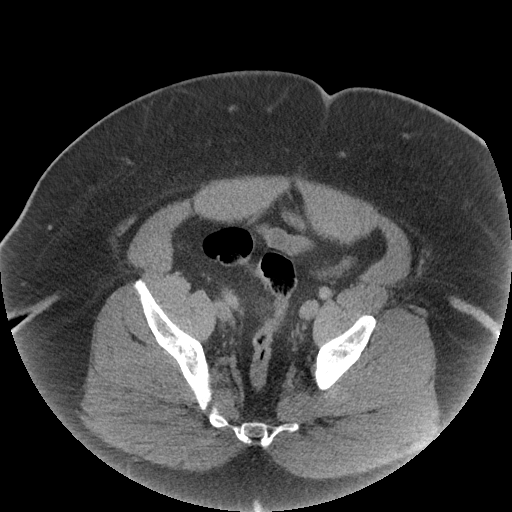
[im 36/101  soft-tissue]
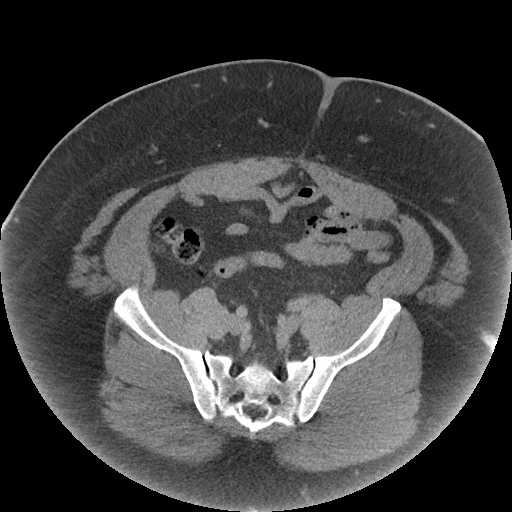
[im 42/101  soft-tissue]
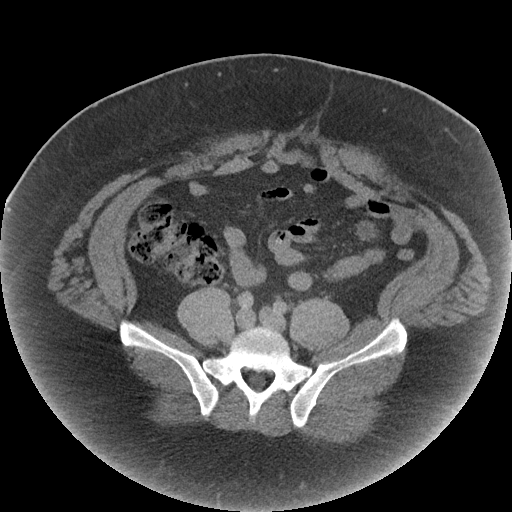
[im 48/101  soft-tissue]
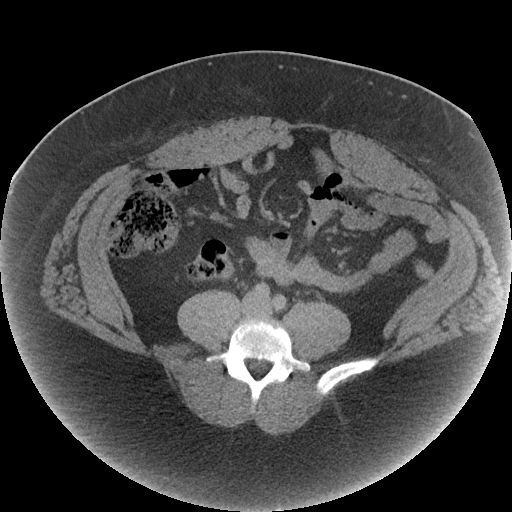
[im 53/101  soft-tissue]
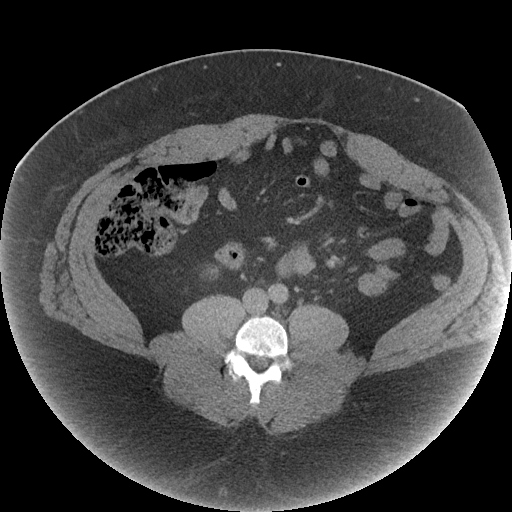
[im 59/101  soft-tissue]
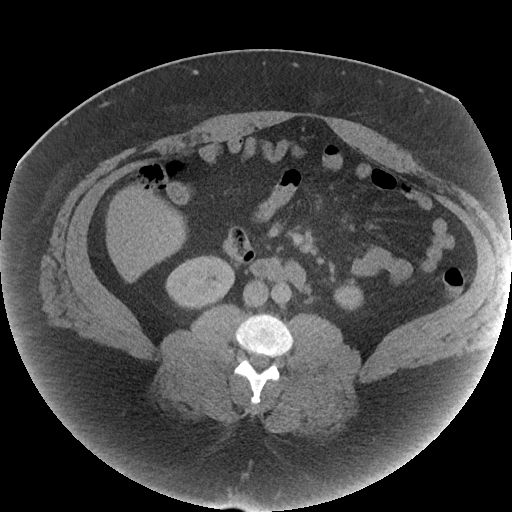
[im 59/101  bone]
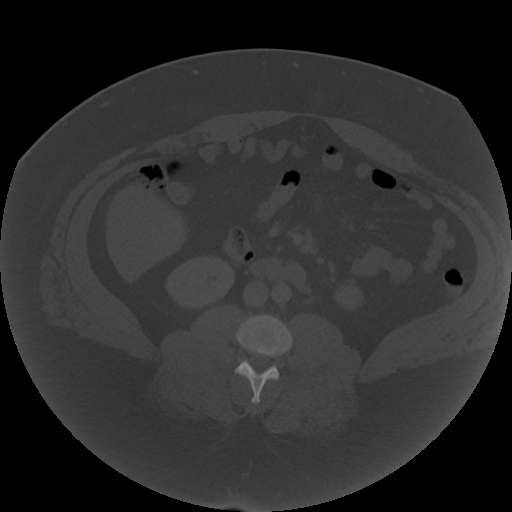
[im 65/101  soft-tissue]
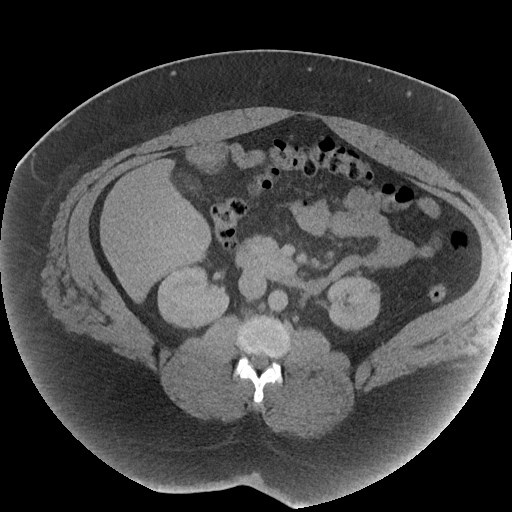
[im 77/101  soft-tissue]
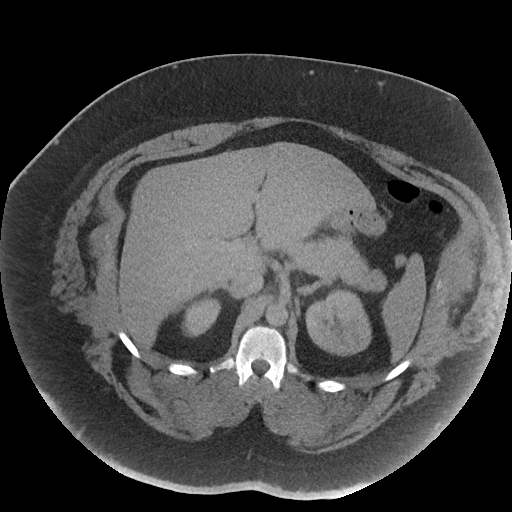
[im 83/101  soft-tissue]
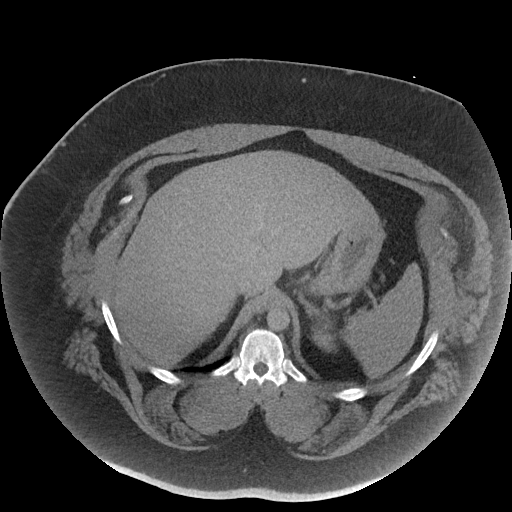
[im 89/101  soft-tissue]
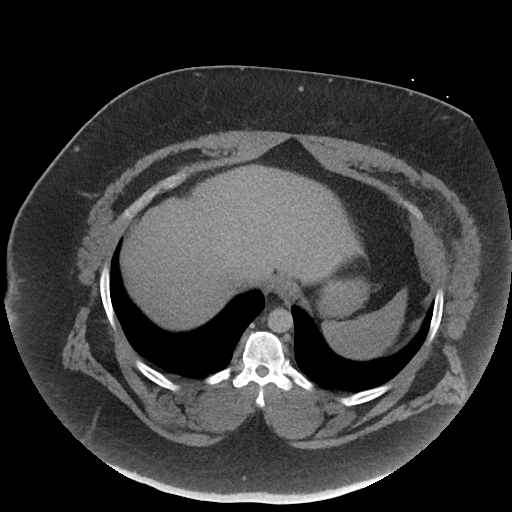
[im 95/101  soft-tissue]
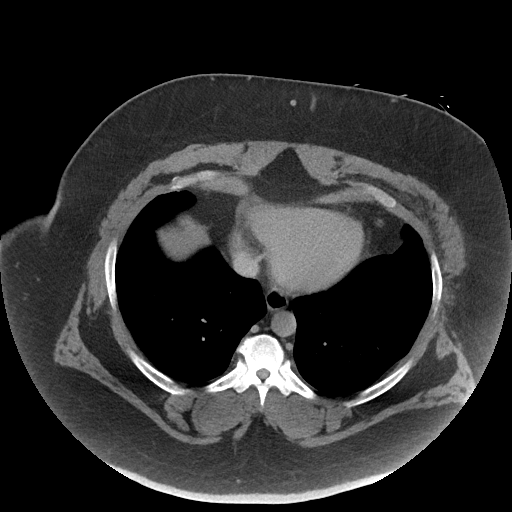

[Series 6: coronal · coronal · 1.01mm/px · 3 of 148 slices shown]
[im 50/148  soft-tissue]
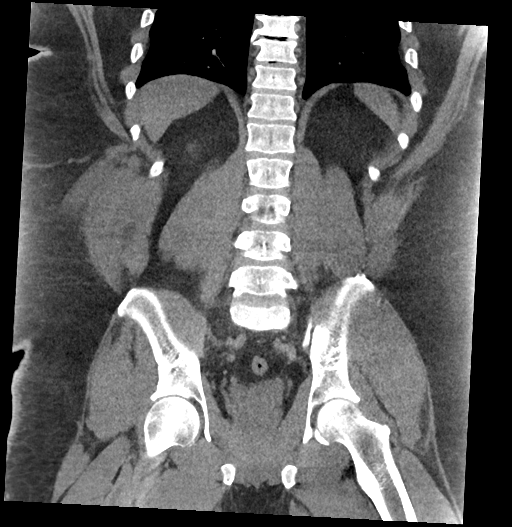
[im 66/148  soft-tissue]
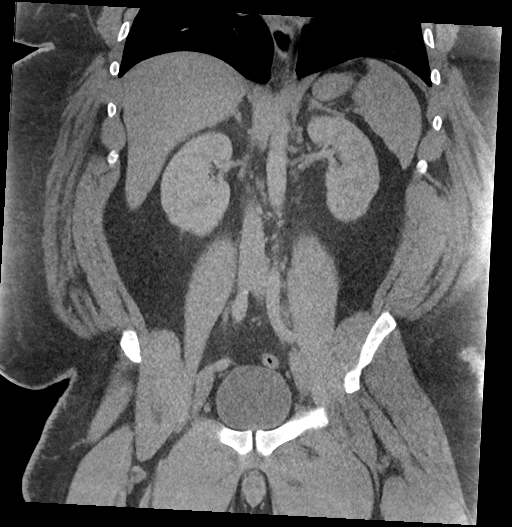
[im 82/148  soft-tissue]
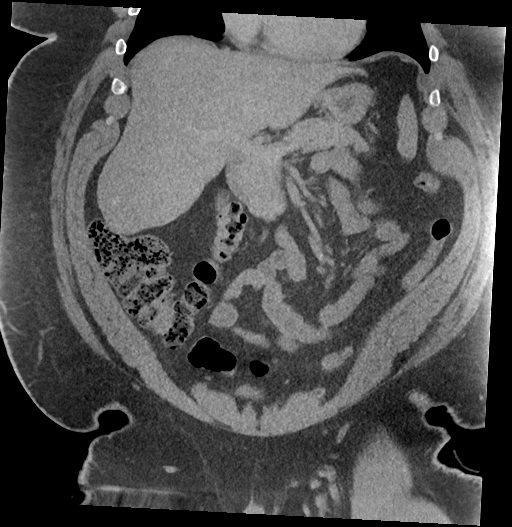

[17 of 46 positions shown; findings below may reference images not displayed]

FINDINGS: Lower chest: No acute abnormality.

Hepatobiliary: No focal liver abnormality is seen. No gallstones,
gallbladder wall thickening, or biliary dilatation.

Pancreas: Unremarkable. No pancreatic ductal dilatation or
surrounding inflammatory changes.

Spleen: Normal in size without focal abnormality.

Adrenals/Urinary Tract: Adrenal glands are within normal limits.
Kidneys demonstrate a normal enhancement pattern bilaterally. Normal
excretion is noted bilaterally. No renal calculi or obstructive
changes are seen. The bladder is partially distended.

Stomach/Bowel: Colon shows no obstructive or inflammatory changes.
The appendix is within normal limits. Small bowel and stomach are
unremarkable.

Vascular/Lymphatic: No significant vascular findings are present. No
enlarged abdominal or pelvic lymph nodes.

Reproductive: Prostate is unremarkable.

Other: No abdominal wall hernia or abnormality. No abdominopelvic
ascites.

Musculoskeletal: No acute or significant osseous findings.
IMPRESSION: No evidence of diverticulitis.

No acute abnormality is identified to correspond with the given
clinical history.

## 2022-05-05 ENCOUNTER — Telehealth: Payer: Self-pay | Admitting: Physician Assistant

## 2022-05-05 DIAGNOSIS — Z20818 Contact with and (suspected) exposure to other bacterial communicable diseases: Secondary | ICD-10-CM

## 2022-05-05 DIAGNOSIS — J029 Acute pharyngitis, unspecified: Secondary | ICD-10-CM

## 2022-05-05 MED ORDER — AMOXICILLIN 500 MG PO TABS: 500.0000 mg | ORAL_TABLET | Freq: Two times a day (BID) | ORAL | 0 refills | Status: AC

## 2022-05-05 NOTE — Progress Notes (Signed)
I have spent 5 minutes in review of e-visit questionnaire, review and updating patient chart, medical decision making and response to patient.   Jhonathan Cody Keosha Rossa, PA-C    

## 2022-05-05 NOTE — Progress Notes (Signed)

## 2022-05-08 ENCOUNTER — Encounter (HOSPITAL_BASED_OUTPATIENT_CLINIC_OR_DEPARTMENT_OTHER): Payer: Self-pay

## 2022-05-08 ENCOUNTER — Other Ambulatory Visit: Payer: Self-pay

## 2022-05-08 DIAGNOSIS — I10 Essential (primary) hypertension: Secondary | ICD-10-CM | POA: Insufficient documentation

## 2022-05-08 MED ORDER — LISINOPRIL 10 MG PO TABS: 20.0000 mg | ORAL_TABLET | Freq: Once | ORAL | Status: DC

## 2022-05-08 MED ORDER — AMLODIPINE BESYLATE 5 MG PO TABS
10.0000 mg | ORAL_TABLET | Freq: Once | ORAL | Status: AC
  Administered 2022-05-08: 10 mg via ORAL
  Filled 2022-05-08: qty 2

## 2022-05-08 NOTE — ED Triage Notes (Addendum)
Pt arrives POV from home with elevated BP.  Says he was taking Lisinopril but ran out about a month ago.  Says he feels a little dizzy occasionally.  Denies chest pain or shortness of breath.  He says Lisinopril made him cough.  He is in the process of getting new insurance and a new PCP.

## 2022-05-09 ENCOUNTER — Emergency Department (HOSPITAL_BASED_OUTPATIENT_CLINIC_OR_DEPARTMENT_OTHER)
Admission: EM | Admit: 2022-05-09 | Discharge: 2022-05-09 | Disposition: A | Discharge status: Home / Self Care | Payer: Commercial Managed Care - HMO | Attending: Emergency Medicine | Admitting: Emergency Medicine

## 2022-05-09 DIAGNOSIS — I10 Essential (primary) hypertension: Secondary | ICD-10-CM

## 2022-05-09 MED ORDER — AMLODIPINE BESYLATE 10 MG PO TABS: 10.0000 mg | ORAL_TABLET | Freq: Every day | ORAL | 1 refills | Status: DC

## 2022-05-09 NOTE — ED Provider Notes (Signed)
[facility] Emergency Department Provider Note MRN:  [id number]  Arrival date & time: 05/09/22     Chief Complaint   Hypertension   History of Present Illness   Benjamin Meyer is a 46 y.o. year-old male with a history of hypertension presenting to the ED with chief complaint of hypertension.  Patient overall feels well, during work today he suspected that his blood pressure was high.  He has had high blood pressure for a long time and he explains that he can just tell when his blood pressure is high.  Describes it as some mild malaise and very mild intermittent lightheadedness.  Ran out of his blood pressure medication about a month ago.  Denies headache or vision change, no chest pain or shortness of breath, no other complaints.  Review of Systems  A thorough review of systems was obtained and all systems are negative except as noted in the HPI and PMH.   Patient's Health History    Past Medical History:  Diagnosis Date   Gout    Hypertension     History reviewed. No pertinent surgical history.  Family History  Problem Relation Age of Onset   Hypertension Mother    Diabetes Mother    Heart failure Father    Hypertension Father     Social History   Socioeconomic History   Marital status: Married    Spouse name: Not on file   Number of children: Not on file   Years of education: Not on file   Highest education level: Not on file  Occupational History   Not on file  Tobacco Use   Smoking status: Never   Smokeless tobacco: Never  Vaping Use   Vaping Use: Never used  Substance and Sexual Activity   Alcohol use: Yes    Comment: occasionally   Drug use: No   Sexual activity: Not on file  Other Topics Concern   Not on file  Social History Narrative   Not on file   Social Determinants of Health   Financial Resource Strain: Not on file  Food Insecurity: Not on file  Transportation Needs: Not on file  Physical Activity: Not on file   Stress: Not on file  Social Connections: Not on file  Intimate Partner Violence: Not on file     Physical Exam   Vitals:   05/09/22 0036 05/09/22 0037  BP: (!) 157/89   Pulse:  86  Resp:    Temp:    SpO2:  94%    CONSTITUTIONAL: Well-appearing, NAD NEURO/PSYCH:  Alert and oriented x 3, no focal deficits EYES:  eyes equal and reactive ENT/NECK:  no LAD, no JVD CARDIO: Regular rate, well-perfused, normal S1 and S2 PULM:  CTAB no wheezing or rhonchi GI/GU:  non-distended, non-tender MSK/SPINE:  No gross deformities, no edema SKIN:  no rash, atraumatic   *Additional and/or pertinent findings included in MDM below  Diagnostic and Interventional Summary    EKG Interpretation  Date/Time:  Tuesday May 08 2022 23:13:33 EDT Ventricular Rate:  90 PR Interval:  154 QRS Duration: 100 QT Interval:  382 QTC Calculation: 467 R Axis:   -3 Text Interpretation: Normal sinus rhythm Normal ECG When compared with ECG of 28-Mar-2021 23:59, No significant change was found Confirmed by Kennis Carina [phone]) on 05/09/2022 12:35:11 AM       Labs Reviewed - No data to display  No orders to display    Medications  amLODipine (NORVASC) tablet 10 mg (10 mg  Oral Given 05/08/22 2319)     Procedures  /  Critical Care Procedures  ED Course and Medical Decision Making  Initial Impression and Ddx Hypertension with some mild lightheadedness which is now resolved.  Patient was given amlodipine in triage and his blood pressure is much improved.  Has developed a fairly predictable and irritating cough when he takes lisinopril.  He is without symptoms or complaints.  EKG is reassuring.  Normal neurological exam, nothing to suggest emergent process, appropriate for discharge.  Past medical/surgical history that increases complexity of ED encounter: Obesity  Interpretation of Diagnostics I personally reviewed the EKG and my interpretation is as follows: Sinus rhythm without concerning ischemic  features    Patient Reassessment and Ultimate Disposition/Management     Discharge  Patient management required discussion with the following services or consulting groups:  None  Complexity of Problems Addressed Acute illness or injury that poses threat of life of bodily function  Additional Data Reviewed and Analyzed Further history obtained from: Past medical history and medications listed in the EMR  Additional Factors Impacting ED Encounter Risk None  Barth Kirks. Sedonia Small, [place] [email or web address]  Final Clinical Impressions(s) / ED Diagnoses     ICD-10-CM   1. Hypertension, unspecified type  I10       ED Discharge Orders          Ordered    amLODipine (NORVASC) 10 MG tablet  Daily        05/09/22 0053             Discharge Instructions Discussed with and Provided to Patient:    Discharge Instructions      You were evaluated in the Emergency Department and after careful evaluation, we did not find any emergent condition requiring admission or further testing in the hospital.  Your exam/testing today is overall reassuring.  Recommend taking the amlodipine medication daily for your blood pressure.  Recommend visiting [email or web address] to establish with a primary care doctor.  Please return to the Emergency Department if you experience any worsening of your condition.   Thank you for allowing [place] to be a part of your care.      Maudie Flakes, MD 05/09/22 [phone]

## 2022-05-09 NOTE — ED Notes (Signed)
Patient states that he "feels a lot better, I knew I just needed my medicine". Requesting 30 day supply of BP medication given previously and referral to PCP.

## 2022-05-09 NOTE — Discharge Instructions (Signed)
You were evaluated in the Emergency Department and after careful evaluation, we did not find any emergent condition requiring admission or further testing in the hospital.  Your exam/testing today is overall reassuring.  Recommend taking the amlodipine medication daily for your blood pressure.  Recommend visiting [email or web address] to establish with a primary care doctor.  Please return to the Emergency Department if you experience any worsening of your condition.   Thank you for allowing [place] to be a part of your care.

## 2022-05-10 ENCOUNTER — Ambulatory Visit
Admission: RE | Admit: 2022-05-10 | Discharge: 2022-05-10 | Disposition: A | Discharge status: Home / Self Care | Payer: Commercial Managed Care - HMO | Source: Ambulatory Visit | Attending: Family Medicine | Admitting: Family Medicine

## 2022-05-10 ENCOUNTER — Telehealth: Payer: Self-pay | Admitting: Family Medicine

## 2022-05-10 VITALS — BP 143/74 | HR 91 | Temp 97.9°F | Resp 19

## 2022-05-10 DIAGNOSIS — R42 Dizziness and giddiness: Secondary | ICD-10-CM | POA: Diagnosis not present

## 2022-05-10 LAB — POCT URINALYSIS DIP (MANUAL ENTRY)
Blood, UA: NEGATIVE
Glucose, UA: NEGATIVE mg/dL
Leukocytes, UA: NEGATIVE
Nitrite, UA: NEGATIVE
Protein Ur, POC: >=300 mg/dL — AB
Spec Grav, UA: >=1.03 — AB (ref 1.010–1.025)
Urobilinogen, UA: 0.2 E.U./dL
pH, UA: 5.5 (ref 5.0–8.0)

## 2022-05-10 LAB — POCT FASTING CBG KUC MANUAL ENTRY: POCT Glucose (KUC): 118 mg/dL — AB (ref 70–99)

## 2022-05-10 MED ORDER — MECLIZINE HCL 25 MG PO TABS: 25.0000 mg | ORAL_TABLET | Freq: Three times a day (TID) | ORAL | 0 refills | Status: DC | PRN

## 2022-05-10 NOTE — ED Triage Notes (Signed)
Pt c/o intermittent dizziness x 2 days. No PMH of diabetes but it does run in family. Was seen in ED yesterday for HTN and dizziness. Changed from Lisinopril to Amlodipine. Is currently in search of new PCP.

## 2022-05-10 NOTE — ED Provider Notes (Signed)
[facility]    CSN: [id number] Arrival date & time: 05/10/22  1655      History   Chief Complaint Chief Complaint  Patient presents with   Dizziness    HPI Benjamin Meyer is a 46 y.o. male.   HPI 47 year old male presents with intermittent dizziness for 2 days.  Patient reports was seen at [facility] ED yesterday for hypertension and dizziness.  Reports MD changed BP medication from lisinopril to amlodipine and he is in currently search of a new PCP.  PMH significant for hypertension, morbid obesity, and gout.  Past Medical History:  Diagnosis Date   Gout    Hypertension     There are no problems to display for this patient.   History reviewed. No pertinent surgical history.     Home Medications    Prior to Admission medications   Medication Sig Start Date End Date Taking? Authorizing Provider  meclizine (ANTIVERT) 25 MG tablet Take 1 tablet (25 mg total) by mouth 3 (three) times daily as needed for dizziness. 05/10/22  Yes Trevor Iha, FNP  amLODipine (NORVASC) 10 MG tablet Take 1 tablet (10 mg total) by mouth daily. 05/09/22   Sabas Sous, MD  amoxicillin (AMOXIL) 500 MG tablet Take 1 tablet (500 mg total) by mouth 2 (two) times daily for 10 days. 05/05/22 05/15/22  Waldon Merl, PA-C  colchicine 0.6 MG tablet Take 2 tabs immediately, then 1 tab twice per day for the duration of the flare up to a max of 7 days 06/07/21   Jannifer Rodney A, FNP  cyclobenzaprine (FLEXERIL) 10 MG tablet Take 1 tablet (10 mg total) by mouth 3 (three) times daily as needed for muscle spasms. 03/24/21   Daphine Deutscher, Mary-Margaret, FNP  methocarbamol (ROBAXIN) 500 MG tablet Take 1 tablet (500 mg total) by mouth 2 (two) times daily. 03/29/21   Sabas Sous, MD  naproxen (NAPROSYN) 500 MG tablet Take 1 tablet (500 mg total) by mouth 2 (two) times daily with a meal. 12/21/21   Waldon Merl, PA-C  omeprazole (PRILOSEC) 20 MG capsule Take 1 capsule (20 mg total) by mouth  daily. 03/29/21   Sabas Sous, MD  polyethylene glycol (MIRALAX) 17 g packet Take 17 g by mouth daily as needed. 06/01/21   Wallis Bamberg, PA-C  sucralfate (CARAFATE) 1 g tablet Take 1 tablet (1 g total) by mouth 4 (four) times daily as needed. 03/29/21   Sabas Sous, MD  tiZANidine (ZANAFLEX) 2 MG tablet Take 2 tablets (4 mg total) by mouth every 8 (eight) hours as needed for muscle spasms. 06/01/21   Wallis Bamberg, PA-C  triamcinolone ointment (KENALOG) 0.5 % Apply 1 application topically 2 (two) times daily. 07/02/21   Junie Spencer, FNP    Family History Family History  Problem Relation Age of Onset   Hypertension Mother    Diabetes Mother    Heart failure Father    Hypertension Father     Social History Social History   Tobacco Use   Smoking status: Never   Smokeless tobacco: Never  Vaping Use   Vaping Use: Never used  Substance Use Topics   Alcohol use: Yes    Comment: occasionally   Drug use: No     Allergies   Patient has no known allergies.   Review of Systems Review of Systems  All other systems reviewed and are negative.    Physical Exam Triage Vital Signs ED Triage Vitals  Enc Vitals  Group     BP 05/10/22 1713 (!) 143/74     Pulse Rate 05/10/22 1713 91     Resp 05/10/22 1713 19     Temp 05/10/22 1713 97.9 F (36.6 C)     Temp Source 05/10/22 1713 Oral     SpO2 05/10/22 1713 95 %     Weight --      Height --      Head Circumference --      Peak Flow --      Pain Score 05/10/22 1710 0     Pain Loc --      Pain Edu? --      Excl. in GC? --    No data found.  Updated Vital Signs BP (!) 143/74 (BP Location: Right Wrist)   Pulse 91   Temp 97.9 F (36.6 C) (Oral)   Resp 19   SpO2 95%    Physical Exam Vitals and nursing note reviewed.  Constitutional:      General: He is not in acute distress.    Appearance: Normal appearance. He is obese. He is not ill-appearing.  HENT:     Head: Normocephalic and atraumatic.     Right Ear:  Tympanic membrane, ear canal and external ear normal.     Left Ear: Tympanic membrane, ear canal and external ear normal.     Nose: Nose normal.     Mouth/Throat:     Mouth: Mucous membranes are moist.     Pharynx: Oropharynx is clear.  Eyes:     Extraocular Movements: Extraocular movements intact.     Conjunctiva/sclera: Conjunctivae normal.     Pupils: Pupils are equal, round, and reactive to light.  Cardiovascular:     Rate and Rhythm: Normal rate and regular rhythm.     Pulses: Normal pulses.     Heart sounds: Normal heart sounds.  Pulmonary:     Effort: Pulmonary effort is normal.     Breath sounds: Normal breath sounds. No wheezing, rhonchi or rales.  Musculoskeletal:        General: Normal range of motion.     Cervical back: Normal range of motion and neck supple.  Skin:    General: Skin is warm and dry.  Neurological:     General: No focal deficit present.     Mental Status: He is alert and oriented to person, place, and time.      UC Treatments / Results  Labs (all labs ordered are listed, but only abnormal results are displayed) Labs Reviewed  POCT FASTING CBG [facility] MANUAL ENTRY - Abnormal; Notable for the following components:      Result Value   POCT Glucose ([facility]) 118 (*)    All other components within normal limits  POCT URINALYSIS DIP (MANUAL ENTRY) - Abnormal; Notable for the following components:   Bilirubin, UA small (*)    Ketones, POC UA trace (5) (*)    Spec Grav, UA >=1.030 (*)    Protein Ur, POC >=300 (*)    All other components within normal limits    EKG   Radiology No results found.  Procedures Procedures (including critical care time)  Medications Ordered in UC Medications - No data to display  Initial Impression / Assessment and Plan / UC Course  I have reviewed the triage vital signs and the nursing notes.  Pertinent labs & imaging results that were available during my care of the patient were reviewed by me and considered in my  medical decision making (see chart for details).     MDM: 1. Dizziness-Rx'd meclizine, UA revealed dehydration.  Encourage patient to monitor his blood pressure daily in the morning prior to eating and to log measurements so that new PCP can evaluate daily blood pressure trends. Advised patient to monitor daily blood pressure trends by checking blood pressure in the morning after voiding and prior to breakfast.  Advised patient to log measurements so that new PCP can evaluate daily blood pressure trends.  Advised patient to increase daily water intake to 64 ounces per day 7 days/week based on recent urinalysis he appears quite dehydrated.  Advised may use Antivert daily or as needed for dizziness.  Advised if symptoms worsen and/or unresolved please follow-up with new PCP or go to nearest ED for further evaluation.  Patient discharged home, hemodynamically stable. Final Clinical Impressions(s) / UC Diagnoses   Final diagnoses:  Dizziness     Discharge Instructions      Advised patient to increase daily water intake to 64 ounces per day 7 days/week based on recent urinalysis he appears quite dehydrated.  Advised may use Antivert daily or as needed for dizziness.  Advised if symptoms worsen and/or unresolved please follow-up with new PCP or go to nearest ED for further evaluation.     ED Prescriptions     Medication Sig Dispense Auth. Provider   meclizine (ANTIVERT) 25 MG tablet Take 1 tablet (25 mg total) by mouth 3 (three) times daily as needed for dizziness. 30 tablet Eliezer Lofts, FNP      PDMP not reviewed this encounter.   Eliezer Lofts, [place] 05/10/22 [phone]

## 2022-05-10 NOTE — Discharge Instructions (Addendum)
Advised patient to monitor daily blood pressure trends by checking blood pressure in the morning after voiding and prior to breakfast.  Advised patient to log measurements so that new PCP can evaluate daily blood pressure trends.  Advised patient to increase daily water intake to 64 ounces per day 7 days/week based on recent urinalysis he appears quite dehydrated.  Advised may use Antivert daily or as needed for dizziness.  Advised if symptoms worsen and/or unresolved please follow-up with new PCP or go to nearest ED for further evaluation.

## 2022-05-10 NOTE — Progress Notes (Signed)
[place]   Needs to do VV to have more clarifying questions on current symptoms.

## 2022-05-11 ENCOUNTER — Telehealth: Payer: Self-pay | Admitting: Emergency Medicine

## 2022-05-11 NOTE — Telephone Encounter (Signed)
Spoke with patient states that he is doing well.  He was going to follow up and get his PCP appointment.  All questions answered.  Will follow up as needed.

## 2022-06-11 ENCOUNTER — Ambulatory Visit: Payer: Commercial Managed Care - HMO | Admitting: Family Medicine

## 2022-06-12 ENCOUNTER — Ambulatory Visit (INDEPENDENT_AMBULATORY_CARE_PROVIDER_SITE_OTHER): Payer: Commercial Managed Care - HMO | Admitting: Family Medicine

## 2022-06-12 DIAGNOSIS — Z91199 Patient's noncompliance with other medical treatment and regimen due to unspecified reason: Secondary | ICD-10-CM

## 2022-06-12 NOTE — Progress Notes (Signed)
No show  Charlton Amor, DO

## 2022-06-19 ENCOUNTER — Telehealth: Payer: Commercial Managed Care - HMO | Admitting: Family

## 2022-06-19 DIAGNOSIS — M109 Gout, unspecified: Secondary | ICD-10-CM | POA: Diagnosis not present

## 2022-06-19 MED ORDER — COLCHICINE 0.6 MG PO TABS: ORAL_TABLET | ORAL | 0 refills | Status: DC

## 2022-06-19 MED ORDER — INDOMETHACIN 50 MG PO CAPS: 50.0000 mg | ORAL_CAPSULE | Freq: Three times a day (TID) | ORAL | 0 refills | Status: DC

## 2022-06-19 NOTE — Progress Notes (Signed)
E-Visit for Gout Symptoms  We are sorry that you are not feeling well. We are here to help!  Based on what you shared with me it looks like you have a flare of your gout.  Gout is a form of arthritis. It can cause pain and swelling in the joints. At first, it tends to affect only 1 joint - most frequently the big toe. It happens in people who have too much uric acid in the blood. Uric acid is a chemical that is produced when the body breaks down certain foods. Uric acid can form sharp needle-like crystals that build up in the joints and cause pain. Uric acid crystals can also form inside the tubes that carry urine from the kidneys to the bladder. These crystals can turn into "kidney stones" that can cause pain and problems with the flow of urine. People with gout get sudden "flares" or attacks of severe pain, most often the big toe, ankle, or knee. Often the joint also turns red and swells. Usually, only 1 joint is affected, but some people have pain in more than 1 joint. Gout flares tend to happen more often during the night.  The pain from gout can be extreme. The pain and swelling are worst at the beginning of a gout flare. The symptoms then get better within a few days to weeks. It is not clear how the body "turns off" a gout flare.  Do not start any NEW preventative medicine until the gout has cleared completely. However, If you are already on Probenecid or Allopurinol for CHRONIC gout, you may continue taking this during an active flare up  I have prescribed Indomethacin 50mg  three times daily for moderate to severe pain for no more than 7 days and I have prescribed Colchicine 0.6 mg tabs - Take 2 tabs immediately, then 1 tab twice per day for the duration of the flare up to a max of 7 days (but discontinue for stomach pains or diarrhea)    HOME CARE Losing weight can help relieve gout. It's not clear that following a specific diet plan will help with gout symptoms but eating a balanced diet can  help improve your overall health. It can also help you lose weight, if you are overweight. In general, a healthy diet includes plenty of fruits, vegetables, whole grains, and low-fat dairy products (labelled "low fat", skim, 2%). Avoid sugar sweetened drinks (including sodas, tea, juice and juice blends, coffee drinks and sports drinks) Limit alcohol to 1-2 drinks of beer, spirits or wine daily these can make gout flares worse. Some people with gout also have other health problems, such as heart disease, high blood pressure, kidney disease, or obesity. If you have any of these issues, it's important to work with your doctor to manage them. This can help improve your overall health and might also help with your gout.  GET HELP RIGHT AWAY IF: Your symptoms persist after you have completed your treatment plan You develop severe diarrhea You develop abnormal sensations  You develop vomiting,   You develop weakness  You develop abdominal pain  FOLLOW UP WITH YOUR PRIMARY PROVIDER IF: If your symptoms do not improve within 10 days  MAKE SURE YOU  Understand these instructions. Will watch your condition. Will get help right away if you are not doing well or get worse.  Thank you for choosing an e-visit.  Your e-visit answers were reviewed by a board certified advanced clinical practitioner to complete your personal care plan.  Depending upon the condition, your plan could have included both over the counter or prescription medications.  Please review your pharmacy choice. Make sure the pharmacy is open so you can pick up prescription now. If there is a problem, you may contact your provider through [employer] and have the prescription routed to another pharmacy.  Your safety is important to [place]. If you have drug allergies check your prescription carefully.   For the next 24 hours you can use MyChart to ask questions about today's visit, request a non-urgent call back, or ask for a work or  school excuse. You will get an email in the next two days asking about your experience. I hope that your e-visit has been valuable and will speed your recovery.   Approximately 5 minutes was spent documenting and reviewing patient's chart.

## 2022-07-02 ENCOUNTER — Telehealth: Payer: Commercial Managed Care - HMO | Admitting: Physician Assistant

## 2022-07-02 DIAGNOSIS — R6889 Other general symptoms and signs: Secondary | ICD-10-CM | POA: Diagnosis not present

## 2022-07-02 MED ORDER — OSELTAMIVIR PHOSPHATE 75 MG PO CAPS: 75.0000 mg | ORAL_CAPSULE | Freq: Two times a day (BID) | ORAL | 0 refills | Status: DC

## 2022-07-02 MED ORDER — BENZONATATE 100 MG PO CAPS: 100.0000 mg | ORAL_CAPSULE | Freq: Three times a day (TID) | ORAL | 0 refills | Status: DC | PRN

## 2022-07-02 NOTE — Progress Notes (Signed)
E visit for Flu like symptoms   We are sorry that you are not feeling well.  Here is how we plan to help! Based on what you have shared with me it looks like you may have a respiratory virus that may be influenza.  Influenza or "the flu" is   an infection caused by a respiratory virus. The flu virus is highly contagious and persons who did not receive their yearly flu vaccination may "catch" the flu from close contact.  We have anti-viral medications to treat the viruses that cause this infection. They are not a "cure" and only shorten the course of the infection. These prescriptions are most effective when they are given within the first 2 days of "flu" symptoms. Antiviral medication are indicated if you have a high risk of complications from the flu. You should  also consider an antiviral medication if you are in close contact with someone who is at risk. These medications can help patients avoid complications from the flu  but have side effects that you should know. Possible side effects from Tamiflu or oseltamivir include nausea, vomiting, diarrhea, dizziness, headaches, eye redness, sleep problems or other respiratory symptoms. You should not take Tamiflu if you have an allergy to oseltamivir or any to the ingredients in Tamiflu.  Based upon your symptoms and potential risk factors I have prescribed Oseltamivir (Tamiflu).  It has been sent to your designated pharmacy.  You will take one 75 mg capsule orally twice a day for the next 5 days. I have also prescribed Tessalon perles for cough.  ANYONE WHO HAS FLU SYMPTOMS SHOULD: Stay home. The flu is highly contagious and going out or to work exposes others! Be sure to drink plenty of fluids. Water is fine as well as fruit juices, sodas and electrolyte beverages. You may want to stay away from caffeine or alcohol. If you are nauseated, try taking small sips of liquids. How do you know if you are getting enough fluid? Your urine should be a pale yellow  or almost colorless. Get rest. Taking a steamy shower or using a humidifier may help nasal congestion and ease sore throat pain. Using a saline nasal spray works much the same way. Cough drops, hard candies and sore throat lozenges may ease your cough. Line up a caregiver. Have someone check on you regularly.   GET HELP RIGHT AWAY IF: You cannot keep down liquids or your medications. You become short of breath Your fell like you are going to pass out or loose consciousness. Your symptoms persist after you have completed your treatment plan MAKE SURE YOU  Understand these instructions. Will watch your condition. Will get help right away if you are not doing well or get worse.  Your e-visit answers were reviewed by a board certified advanced clinical practitioner to complete your personal care plan.  Depending on the condition, your plan could have included both over the counter or prescription medications.  If there is a problem please reply  once you have received a response from your provider.  Your safety is important to us.  If you have drug allergies check your prescription carefully.    You can use MyChart to ask questions about today's visit, request a non-urgent call back, or ask for a work or school excuse for 24 hours related to this e-Visit. If it has been greater than 24 hours you will need to follow up with your provider, or enter a new e-Visit to address those concerns.    You will get an e-mail in the next two days asking about your experience.  I hope that your e-visit has been valuable and will speed your recovery. Thank you for using e-visits.  I have spent 5 minutes in review of e-visit questionnaire, review and updating patient chart, medical decision making and response to patient.   Aneli Zara M Edris Schneck, PA-C  

## 2022-07-26 ENCOUNTER — Telehealth: Payer: Commercial Managed Care - HMO | Admitting: Family

## 2022-07-26 DIAGNOSIS — K649 Unspecified hemorrhoids: Secondary | ICD-10-CM

## 2022-07-26 MED ORDER — HYDROCORTISONE ACETATE 25 MG RE SUPP: 25.0000 mg | Freq: Two times a day (BID) | RECTAL | 0 refills | Status: DC

## 2022-07-26 NOTE — Progress Notes (Signed)

## 2022-07-28 ENCOUNTER — Telehealth: Payer: Commercial Managed Care - HMO | Admitting: Physician Assistant

## 2022-07-28 DIAGNOSIS — M109 Gout, unspecified: Secondary | ICD-10-CM

## 2022-07-28 MED ORDER — COLCHICINE 0.6 MG PO TABS: ORAL_TABLET | ORAL | 0 refills | Status: DC

## 2022-07-28 MED ORDER — INDOMETHACIN 50 MG PO CAPS: 50.0000 mg | ORAL_CAPSULE | Freq: Three times a day (TID) | ORAL | 0 refills | Status: DC

## 2022-07-28 NOTE — Progress Notes (Signed)
E-Visit for Gout Symptoms  We are sorry that you are not feeling well. We are here to help!  Based on what you shared with me it looks like you have a flare of your gout.  Gout is a form of arthritis. It can cause pain and swelling in the joints. At first, it tends to affect only 1 joint - most frequently the big toe. It happens in people who have too much uric acid in the blood. Uric acid is a chemical that is produced when the body breaks down certain foods. Uric acid can form sharp needle-like crystals that build up in the joints and cause pain. Uric acid crystals can also form inside the tubes that carry urine from the kidneys to the bladder. These crystals can turn into "kidney stones" that can cause pain and problems with the flow of urine. People with gout get sudden "flares" or attacks of severe pain, most often the big toe, ankle, or knee. Often the joint also turns red and swells. Usually, only 1 joint is affected, but some people have pain in more than 1 joint. Gout flares tend to happen more often during the night.  The pain from gout can be extreme. The pain and swelling are worst at the beginning of a gout flare. The symptoms then get better within a few days to weeks. It is not clear how the body "turns off" a gout flare.  Do not start any NEW preventative medicine until the gout has cleared completely. However, If you are already on Probenecid or Allopurinol for CHRONIC gout, you may continue taking this during an active flare up  I have prescribed Indomethacin 50mg  three times daily for moderate to severe pain for no more than 7 days and I have prescribed Colchicine 0.6 mg tabs - Take 2 tabs immediately, then 1 tab twice per day for the duration of the flare up to a max of 7 days (but discontinue for stomach pains or diarrhea)    HOME CARE Losing weight can help relieve gout. It's not clear that following a specific diet plan will help with gout symptoms but eating a balanced diet can  help improve your overall health. It can also help you lose weight, if you are overweight. In general, a healthy diet includes plenty of fruits, vegetables, whole grains, and low-fat dairy products (labelled "low fat", skim, 2%). Avoid sugar sweetened drinks (including sodas, tea, juice and juice blends, coffee drinks and sports drinks) Limit alcohol to 1-2 drinks of beer, spirits or wine daily these can make gout flares worse. Some people with gout also have other health problems, such as heart disease, high blood pressure, kidney disease, or obesity. If you have any of these issues, it's important to work with your doctor to manage them. This can help improve your overall health and might also help with your gout.  GET HELP RIGHT AWAY IF: Your symptoms persist after you have completed your treatment plan You develop severe diarrhea You develop abnormal sensations  You develop vomiting,   You develop weakness  You develop abdominal pain  FOLLOW UP WITH YOUR PRIMARY PROVIDER IF: If your symptoms do not improve within 10 days  MAKE SURE YOU  Understand these instructions. Will watch your condition. Will get help right away if you are not doing well or get worse.  Thank you for choosing an e-visit.  Your e-visit answers were reviewed by a board certified advanced clinical practitioner to complete your personal care plan.  Depending upon the condition, your plan could have included both over the counter or prescription medications.  Please review your pharmacy choice. Make sure the pharmacy is open so you can pick up prescription now. If there is a problem, you may contact your provider through [employer] and have the prescription routed to another pharmacy.  Your safety is important to [place]. If you have drug allergies check your prescription carefully.   For the next 24 hours you can use MyChart to ask questions about today's visit, request a non-urgent call back, or ask for a work or  school excuse. You will get an email in the next two days asking about your experience. I hope that your e-visit has been valuable and will speed your recovery.  Time spent with patient today was 5-10 minutes which consisted of chart review, discussing diagnosis, work up, treatment answering questions and documentation.  Inda Coke PA-C

## 2022-08-02 ENCOUNTER — Telehealth: Payer: Self-pay | Admitting: Physician Assistant

## 2022-08-02 DIAGNOSIS — R21 Rash and other nonspecific skin eruption: Secondary | ICD-10-CM

## 2022-08-02 MED ORDER — TRIAMCINOLONE ACETONIDE 0.5 % EX OINT: 1.0000 | TOPICAL_OINTMENT | Freq: Two times a day (BID) | CUTANEOUS | 0 refills | Status: DC

## 2022-08-02 NOTE — Progress Notes (Signed)
E Visit for Rash  We are sorry that you are not feeling well. Here is how we plan to help!  I have prescribed: Triamcinolone ointment 0.5% Apply topically to affected area twice daily for 10 days, then decrease use to as needed. Do NOT use on your face or near your eyes.  HOME CARE:  Take cool showers and avoid direct sunlight. Apply cool compress or wet dressings. Take a bath in an oatmeal bath.  Sprinkle content of one Aveeno packet under running faucet with comfortably warm water.  Bathe for 15-20 minutes, 1-2 times daily.  Pat dry with a towel. Do not rub the rash. Use hydrocortisone cream. Take an antihistamine like Benadryl for widespread rashes that itch.  The adult dose of Benadryl is 25-50 mg by mouth 4 times daily. Caution:  This type of medication may cause sleepiness.  Do not drink alcohol, drive, or operate dangerous machinery while taking antihistamines.  Do not take these medications if you have prostate enlargement.  Read package instructions thoroughly on all medications that you take.  GET HELP RIGHT AWAY IF:  Symptoms don't go away after treatment. Severe itching that persists. If you rash spreads or swells. If you rash begins to smell. If it blisters and opens or develops a yellow-brown crust. You develop a fever. You have a sore throat. You become short of breath.  MAKE SURE YOU:  Understand these instructions. Will watch your condition. Will get help right away if you are not doing well or get worse.  Thank you for choosing an e-visit.  Your e-visit answers were reviewed by a board certified advanced clinical practitioner to complete your personal care plan. Depending upon the condition, your plan could have included both over the counter or prescription medications.  Please review your pharmacy choice. Make sure the pharmacy is open so you can pick up prescription now. If there is a problem, you may contact your provider through [employer] and have  the prescription routed to another pharmacy.  Your safety is important to [place]. If you have drug allergies check your prescription carefully.   For the next 24 hours you can use MyChart to ask questions about today's visit, request a non-urgent call back, or ask for a work or school excuse. You will get an email in the next two days asking about your experience. I hope that your e-visit has been valuable and will speed your recovery.  I have spent 5 minutes in review of e-visit questionnaire, review and updating patient chart, medical decision making and response to patient.   Mar Daring, PA-C

## 2022-08-14 ENCOUNTER — Telehealth: Payer: Self-pay | Admitting: Physician Assistant

## 2022-08-14 DIAGNOSIS — B372 Candidiasis of skin and nail: Secondary | ICD-10-CM

## 2022-08-14 MED ORDER — FLUCONAZOLE 150 MG PO TABS: 150.0000 mg | ORAL_TABLET | Freq: Once | ORAL | 0 refills | Status: AC

## 2022-08-14 MED ORDER — NYSTATIN 100000 UNIT/GM EX OINT: 1.0000 | TOPICAL_OINTMENT | Freq: Two times a day (BID) | CUTANEOUS | 0 refills | Status: DC

## 2022-08-14 NOTE — Progress Notes (Signed)
E Visit for Rash  We are sorry that you are not feeling well. Here is how we plan to help!  Based upon your presentation it appears you have a yeast infection of the skin.  I have prescribed: and Nystatin cream apply to the affected area twice daily. I have given a single dose of Diflucan as well to expedite recovery.   HOME CARE:  Take cool showers and avoid direct sunlight. Apply cool compress or wet dressings. Take a bath in an oatmeal bath.  Sprinkle content of one Aveeno packet under running faucet with comfortably warm water.  Bathe for 15-20 minutes, 1-2 times daily.  Pat dry with a towel. Do not rub the rash. Use hydrocortisone cream. Take an antihistamine like Benadryl for widespread rashes that itch.  The adult dose of Benadryl is 25-50 mg by mouth 4 times daily. Caution:  This type of medication may cause sleepiness.  Do not drink alcohol, drive, or operate dangerous machinery while taking antihistamines.  Do not take these medications if you have prostate enlargement.  Read package instructions thoroughly on all medications that you take.  GET HELP RIGHT AWAY IF:  Symptoms don't go away after treatment. Severe itching that persists. If you rash spreads or swells. If you rash begins to smell. If it blisters and opens or develops a yellow-brown crust. You develop a fever. You have a sore throat. You become short of breath.  MAKE SURE YOU:  Understand these instructions. Will watch your condition. Will get help right away if you are not doing well or get worse.  Thank you for choosing an e-visit.  Your e-visit answers were reviewed by a board certified advanced clinical practitioner to complete your personal care plan. Depending upon the condition, your plan could have included both over the counter or prescription medications.  Please review your pharmacy choice. Make sure the pharmacy is open so you can pick up prescription now. If there is a problem, you may contact  your provider through [employer] and have the prescription routed to another pharmacy.  Your safety is important to [place]. If you have drug allergies check your prescription carefully.   For the next 24 hours you can use MyChart to ask questions about today's visit, request a non-urgent call back, or ask for a work or school excuse. You will get an email in the next two days asking about your experience. I hope that your e-visit has been valuable and will speed your recovery.

## 2022-08-14 NOTE — Progress Notes (Signed)
Message sent to patient requesting further input regarding current symptoms. Awaiting patient response.  

## 2022-08-14 NOTE — Progress Notes (Signed)
nystatI have spent 5 minutes in review of e-visit questionnaire, review and updating patient chart, medical decision making and response to patient.   Leeanne Rio, PA-C

## 2022-08-23 ENCOUNTER — Telehealth: Payer: Self-pay | Admitting: Physician Assistant

## 2022-08-23 DIAGNOSIS — R21 Rash and other nonspecific skin eruption: Secondary | ICD-10-CM

## 2022-08-23 NOTE — Progress Notes (Signed)
Because we cannot provide refills or continue to treat ongoing/recurring infections, I feel your condition warrants further evaluation and I recommend that you be seen in a face to face visit with your primary care provider to get further refills and for ongoing management. It is our policy that is you are having a recurring or persistent issue despite treatment via e-visit that we have to have you evaluated in person.    NOTE: There will be NO CHARGE for this eVisit   If you are having a true medical emergency please call 911.      For an urgent face to face visit, [facility] has eight urgent care centers for your convenience:   NEW!! [facility] [place] [facility] [place] Get Driving Directions [id number] [address] [place], [place] [facility] [place] [facility] [place] Get Driving Directions [id number] [place], [place] [address]   [facility]) Get Driving Directions [id number] [address], [place] [address]  [facility] [place] ([place]) Get Driving Directions [id number] [address] [place],  [place]  [address]  [facility] [place] [facility] - at [address] [place] Get Driving Directions  [id number] [place],  [place] [address]   [facility] Get Driving Directions [id number] [place], [place], [place] [address]   [facility] Get Driving Directions  [id number] [address] [place], [place] [address]   [facility] [place] Get Driving Directions [id number] [address], [place],  [address]  Your MyChart E-visit questionnaire answers were reviewed by a board certified advanced clinical practitioner to complete your personal care  plan based on your specific symptoms.  Thank you for using e-Visits.

## 2022-10-05 ENCOUNTER — Telehealth: Payer: Self-pay | Admitting: Family Medicine

## 2022-10-05 DIAGNOSIS — B001 Herpesviral vesicular dermatitis: Secondary | ICD-10-CM

## 2022-10-05 MED ORDER — VALACYCLOVIR HCL 1 G PO TABS: 2000.0000 mg | ORAL_TABLET | Freq: Two times a day (BID) | ORAL | 0 refills | Status: AC

## 2022-10-05 NOTE — Progress Notes (Signed)
We are sorry that you are not feeling well.  Here is how we plan to help!  Based on what you have shared with me it does look like you have a viral infection.    Most cold sores or fever blisters are small fluid filled blisters around the mouth caused by herpes simplex virus.  The most common strain of the virus causing cold sores is herpes simplex virus 1.  It can be spread by skin contact, sharing eating utensils, or even sharing towels.  Cold sores are contagious to other people until dry. (Approximately 5-7 days).  Wash your hands. You can spread the virus to your eyes through handling your contact lenses after touching the lesions.  Most people experience pain at the sight or tingling sensations in their lips that may begin before the ulcers erupt.  Herpes simplex is treatable but not curable.  It may lie dormant for a long time and then reappear due to stress or prolonged sun exposure.  Many patients have success in treating their cold sores with an over the counter topical called Abreva.  You may apply the cream up to 5 times daily (maximum 10 days) until healing occurs.  If you would like to use an oral antiviral medication to speed the healing of your cold sore, I have sent a prescription to your local pharmacy Valacyclovir 2 gm take one by mouth twice a day for 1 day    HOME CARE:  Wash your hands frequently. Do not pick at or rub the sore. Don't open the blisters. Avoid kissing other people during this time. Avoid sharing drinking glasses, eating utensils, or razors. Do not handle contact lenses unless you have thoroughly washed your hands with soap and warm water! Avoid oral sex during this time.  Herpes from sores on your mouth can spread to your partner's genital area. Avoid contact with anyone who has eczema or a weakened immune system. Cold sores are often triggered by exposure to intense sunlight, use a lip balm containing a sunscreen (SPF 30 or higher).  GET HELP RIGHT AWAY  IF:  Blisters look infected. Blisters occur near or in the eye. Symptoms last longer than 10 days. Your symptoms become worse.  MAKE SURE YOU:  Understand these instructions. Will watch your condition. Will get help right away if you are not doing well or get worse.    Your e-visit answers were reviewed by a board certified advanced clinical practitioner to complete your personal care plan.  Depending upon the condition, your plan could have  Included both over the counter or prescription medications.    Please review your pharmacy choice.  Be sure that the pharmacy you have chosen is open so that you can pick up your prescription now.  If there is a problem you can message your provider in [place] to have the prescription routed to another pharmacy.    Your safety is important to [place].  If you have drug allergies check our prescription carefully.  For the next 24 hours you can use MyChart to ask questions about today's visit, request a non-urgent call back, or ask for a work or school excuse from your e-visit provider.  You will get an email in the next two days asking about your experience.  I hope that your e-visit has been valuable and will speed your recovery.    have provided 5 minutes of non face to face time during this encounter for chart review and documentation.

## 2022-10-11 ENCOUNTER — Telehealth: Payer: Self-pay | Admitting: Physician Assistant

## 2022-10-11 DIAGNOSIS — J029 Acute pharyngitis, unspecified: Secondary | ICD-10-CM

## 2022-10-11 MED ORDER — PREDNISONE 20 MG PO TABS: 40.0000 mg | ORAL_TABLET | Freq: Every day | ORAL | 0 refills | Status: DC

## 2022-10-11 NOTE — Progress Notes (Signed)
I have spent 5 minutes in review of e-visit questionnaire, review and updating patient chart, medical decision making and response to patient.   Redmond Cody Jhene [place], PA-C    

## 2022-10-11 NOTE — Progress Notes (Signed)
  E-Visit for Sore Throat  We are sorry that you are not feeling well.  Here is how we plan to help!  Your symptoms indicate a likely viral infection (Pharyngitis).   Pharyngitis is inflammation in the back of the throat which can cause a sore throat, scratchiness and sometimes difficulty swallowing.   Pharyngitis is typically caused by a respiratory virus and will just run its course.  Please keep in mind that your symptoms could last up to 10 days.  For throat pain, we recommend over the counter oral pain relief medications such as acetaminophen or aspirin, or anti-inflammatory medications such as ibuprofen or naproxen sodium.  Topical treatments such as oral throat lozenges or sprays may be used as needed.  Avoid close contact with loved ones, especially the very young and elderly.  Remember to wash your hands thoroughly throughout the day as this is the number one way to prevent the spread of infection and wipe down door knobs and counters with disinfectant.  I will start you on a short course of prednisone to reduce tonsillar swelling with this.   After careful review of your answers, I would not recommend an antibiotic for your condition.  Antibiotics should not be used to treat conditions that we suspect are caused by viruses like the virus that causes the common cold or flu. However, some people can have Strep with atypical symptoms. You may need formal testing in clinic or office to confirm if your symptoms continue or worsen.  Providers prescribe antibiotics to treat infections caused by bacteria. Antibiotics are very powerful in treating bacterial infections when they are used properly.  To maintain their effectiveness, they should be used only when necessary.  Overuse of antibiotics has resulted in the development of super bugs that are resistant to treatment!    Home Care: Only take medications as instructed by your medical team. Do not drink alcohol while taking these medications. A  steam or ultrasonic humidifier can help congestion.  You can place a towel over your head and breathe in the steam from hot water coming from a faucet. Avoid close contacts especially the very young and the elderly. Cover your mouth when you cough or sneeze. Always remember to wash your hands.  Get Help Right Away If: You develop worsening fever or throat pain. You develop a severe head ache or visual changes. Your symptoms persist after you have completed your treatment plan.  Make sure you Understand these instructions. Will watch your condition. Will get help right away if you are not doing well or get worse.   Thank you for choosing an e-visit.  Your e-visit answers were reviewed by a board certified advanced clinical practitioner to complete your personal care plan. Depending upon the condition, your plan could have included both over the counter or prescription medications.  Please review your pharmacy choice. Make sure the pharmacy is open so you can pick up prescription now. If there is a problem, you may contact your provider through [employer] and have the prescription routed to another pharmacy.  Your safety is important to [place]. If you have drug allergies check your prescription carefully.   For the next 24 hours you can use MyChart to ask questions about today's visit, request a non-urgent call back, or ask for a work or school excuse. You will get an email in the next two days asking about your experience. I hope that your e-visit has been valuable and will speed your recovery.

## 2022-12-03 ENCOUNTER — Telehealth: Payer: Self-pay | Admitting: Physician Assistant

## 2022-12-03 DIAGNOSIS — J208 Acute bronchitis due to other specified organisms: Secondary | ICD-10-CM

## 2022-12-03 MED ORDER — BENZONATATE 100 MG PO CAPS: 100.0000 mg | ORAL_CAPSULE | Freq: Three times a day (TID) | ORAL | 0 refills | Status: DC | PRN

## 2022-12-03 MED ORDER — PREDNISONE 10 MG (21) PO TBPK: ORAL_TABLET | ORAL | 0 refills | Status: DC

## 2022-12-03 NOTE — Progress Notes (Signed)
E-Visit for Cough  We are sorry that you are not feeling well.  Here is how we plan to help!  Based on your presentation I believe you most likely have A cough due to a virus.  This is called viral bronchitis and is best treated by rest, plenty of fluids and control of the cough.  You may use Ibuprofen or Tylenol as directed to help your symptoms.     In addition you may use A non-prescription cough medication called Mucinex DM: take 2 tablets every 12 hours. and A prescription cough medication called Tessalon Perles 100mg. You may take 1-2 capsules every 8 hours as needed for your cough.  Prednisone 10 mg daily for 6 days (see taper instructions below)  Directions for 6 day taper: Day 1: 2 tablets before breakfast, 1 after both lunch & dinner and 2 at bedtime Day 2: 1 tab before breakfast, 1 after both lunch & dinner and 2 at bedtime Day 3: 1 tab at each meal & 1 at bedtime Day 4: 1 tab at breakfast, 1 at lunch, 1 at bedtime Day 5: 1 tab at breakfast & 1 tab at bedtime Day 6: 1 tab at breakfast  From your responses in the eVisit questionnaire you describe inflammation in the upper respiratory tract which is causing a significant cough.  This is commonly called Bronchitis and has four common causes:   Allergies Viral Infections Acid Reflux Bacterial Infection Allergies, viruses and acid reflux are treated by controlling symptoms or eliminating the cause. An example might be a cough caused by taking certain blood pressure medications. You stop the cough by changing the medication. Another example might be a cough caused by acid reflux. Controlling the reflux helps control the cough.  USE OF BRONCHODILATOR ("RESCUE") INHALERS: There is a risk from using your bronchodilator too frequently.  The risk is that over-reliance on a medication which only relaxes the muscles surrounding the breathing tubes can reduce the effectiveness of medications prescribed to reduce swelling and congestion of the  tubes themselves.  Although you feel brief relief from the bronchodilator inhaler, your asthma may actually be worsening with the tubes becoming more swollen and filled with mucus.  This can delay other crucial treatments, such as oral steroid medications. If you need to use a bronchodilator inhaler daily, several times per day, you should discuss this with your provider.  There are probably better treatments that could be used to keep your asthma under control.     HOME CARE Only take medications as instructed by your medical team. Complete the entire course of an antibiotic. Drink plenty of fluids and get plenty of rest. Avoid close contacts especially the very young and the elderly Cover your mouth if you cough or cough into your sleeve. Always remember to wash your hands A steam or ultrasonic humidifier can help congestion.   GET HELP RIGHT AWAY IF: You develop worsening fever. You become short of breath You cough up blood. Your symptoms persist after you have completed your treatment plan MAKE SURE YOU  Understand these instructions. Will watch your condition. Will get help right away if you are not doing well or get worse.    Thank you for choosing an e-visit.  Your e-visit answers were reviewed by a board certified advanced clinical practitioner to complete your personal care plan. Depending upon the condition, your plan could have included both over the counter or prescription medications.  Please review your pharmacy choice. Make sure the pharmacy is   open so you can pick up prescription now. If there is a problem, you may contact your provider through MyChart messaging and have the prescription routed to another pharmacy.  Your safety is important to us. If you have drug allergies check your prescription carefully.   For the next 24 hours you can use MyChart to ask questions about today's visit, request a non-urgent call back, or ask for a work or school excuse. You will get an  email in the next two days asking about your experience. I hope that your e-visit has been valuable and will speed your recovery.  I have spent 5 minutes in review of e-visit questionnaire, review and updating patient chart, medical decision making and response to patient.   Laruth Hanger M Thomes Burak, PA-C  

## 2022-12-13 ENCOUNTER — Telehealth: Payer: Self-pay | Admitting: Family Medicine

## 2022-12-13 DIAGNOSIS — B001 Herpesviral vesicular dermatitis: Secondary | ICD-10-CM

## 2022-12-13 MED ORDER — VALACYCLOVIR HCL 1 G PO TABS: 2000.0000 mg | ORAL_TABLET | Freq: Two times a day (BID) | ORAL | 0 refills | Status: AC

## 2022-12-13 NOTE — Progress Notes (Signed)

## 2023-01-10 ENCOUNTER — Telehealth: Payer: Self-pay | Admitting: Physician Assistant

## 2023-01-10 DIAGNOSIS — B372 Candidiasis of skin and nail: Secondary | ICD-10-CM

## 2023-01-10 MED ORDER — FLUCONAZOLE 150 MG PO TABS: 150.0000 mg | ORAL_TABLET | ORAL | 0 refills | Status: DC

## 2023-01-10 NOTE — Progress Notes (Signed)
E-Visit for [employer]  We are sorry that you are not feeling well. Here is how we plan to help!  Based on what you shared with me it looks like you have either jock itch or yeast of the skin.  I am prescribing:Fluconazole 150 mg once weekly for two to four weeks  HOME CARE:  Keep affected area clean, dry, and cool. Wash with soap and shampoo after sports or exercise and dry yourself well after bathing or swimming Wear cotton underwear and change them if they become damp or sweaty. Avoid using swimming pools, public showers, or baths.  GET HELP RIGHT AWAY IF:  Symptoms that don't away after treatment. Severe itching that persists. If your rash spreads or swells. If your rash begins to have drainage or smell. You develop a fever.  MAKE SURE YOU   Understand these instructions. Will watch your condition. Will get help right away if you are not doing well or get worse.  Thank you for choosing an e-visit.  Your e-visit answers were reviewed by a board certified advanced clinical practitioner to complete your personal care plan. Depending upon the condition, your plan could have included both over the counter or prescription medications.  Please review your pharmacy choice. Make sure the pharmacy is open so you can pick up prescription now. If there is a problem, you may contact your provider through [employer] and have the prescription routed to another pharmacy.  Your safety is important to [place]. If you have drug allergies check your prescription carefully.   For the next 24 hours you can use MyChart to ask questions about today's visit, request a non-urgent call back, or ask for a work or school excuse. You will get an email in the next two days asking about your experience. I hope that your e-visit has been valuable and will speed your recovery.   References or for more  information:  [email or web address]?search=jock%[email or web address]&source=search_result&selectedTitle=3~52&usage_type=default&display_rank=3

## 2023-01-10 NOTE — Progress Notes (Signed)
I have spent 5 minutes in review of e-visit questionnaire, review and updating patient chart, medical decision making and response to patient.   Arlen Cody Avangeline Stockburger, PA-C    

## 2023-02-23 ENCOUNTER — Telehealth: Payer: Self-pay | Admitting: Family Medicine

## 2023-02-23 DIAGNOSIS — J208 Acute bronchitis due to other specified organisms: Secondary | ICD-10-CM

## 2023-02-23 DIAGNOSIS — R059 Cough, unspecified: Secondary | ICD-10-CM

## 2023-02-23 MED ORDER — PREDNISONE 10 MG (21) PO TBPK: ORAL_TABLET | ORAL | 0 refills | Status: DC

## 2023-02-23 MED ORDER — BENZONATATE 100 MG PO CAPS: 100.0000 mg | ORAL_CAPSULE | Freq: Three times a day (TID) | ORAL | 0 refills | Status: DC | PRN

## 2023-02-23 NOTE — Progress Notes (Signed)
E-Visit for Cough  We are sorry that you are not feeling well.  Here is how we plan to help!  Based on your presentation I believe you most likely have A cough due to a virus.  This is called viral bronchitis and is best treated by rest, plenty of fluids and control of the cough.  You may use Ibuprofen or Tylenol as directed to help your symptoms.     In addition you may use A prescription cough medication called Tessalon Perles 100mg . You may take 1-2 capsules every 8 hours as needed for your cough.   Directions for 6 day taper: Day 1: 2 tablets before breakfast, 1 after both lunch & dinner and 2 at bedtime Day 2: 1 tab before breakfast, 1 after both lunch & dinner and 2 at bedtime Day 3: 1 tab at each meal & 1 at bedtime Day 4: 1 tab at breakfast, 1 at lunch, 1 at bedtime Day 5: 1 tab at breakfast & 1 tab at bedtime Day 6: 1 tab at breakfast  From your responses in the eVisit questionnaire you describe inflammation in the upper respiratory tract which is causing a significant cough.  This is commonly called Bronchitis and has four common causes:   Allergies Viral Infections Acid Reflux Bacterial Infection Allergies, viruses and acid reflux are treated by controlling symptoms or eliminating the cause. An example might be a cough caused by taking certain blood pressure medications. You stop the cough by changing the medication. Another example might be a cough caused by acid reflux. Controlling the reflux helps control the cough.  USE OF BRONCHODILATOR ("RESCUE") INHALERS: There is a risk from using your bronchodilator too frequently.  The risk is that over-reliance on a medication which only relaxes the muscles surrounding the breathing tubes can reduce the effectiveness of medications prescribed to reduce swelling and congestion of the tubes themselves.  Although you feel brief relief from the bronchodilator inhaler, your asthma may actually be worsening with the tubes becoming more  swollen and filled with mucus.  This can delay other crucial treatments, such as oral steroid medications. If you need to use a bronchodilator inhaler daily, several times per day, you should discuss this with your provider.  There are probably better treatments that could be used to keep your asthma under control.     HOME CARE Only take medications as instructed by your medical team. Complete the entire course of an antibiotic. Drink plenty of fluids and get plenty of rest. Avoid close contacts especially the very young and the elderly Cover your mouth if you cough or cough into your sleeve. Always remember to wash your hands A steam or ultrasonic humidifier can help congestion.   GET HELP RIGHT AWAY IF: You develop worsening fever. You become short of breath You cough up blood. Your symptoms persist after you have completed your treatment plan MAKE SURE YOU  Understand these instructions. Will watch your condition. Will get help right away if you are not doing well or get worse.    Thank you for choosing an e-visit.  Your e-visit answers were reviewed by a board certified advanced clinical practitioner to complete your personal care plan. Depending upon the condition, your plan could have included both over the counter or prescription medications.  Please review your pharmacy choice. Make sure the pharmacy is open so you can pick up prescription now. If there is a problem, you may contact your provider through [employer] and have the prescription  routed to another pharmacy.  Your safety is important to [place]. If you have drug allergies check your prescription carefully.   For the next 24 hours you can use MyChart to ask questions about today's visit, request a non-urgent call back, or ask for a work or school excuse. You will get an email in the next two days asking about your experience. I hope that your e-visit has been valuable and will speed your recovery. I have spent 5  minutes in review of e-visit questionnaire, review and updating patient chart, medical decision making and response to patient.   Reed Pandy, PA-C

## 2023-03-06 ENCOUNTER — Telehealth: Payer: Self-pay | Admitting: Nurse Practitioner

## 2023-03-06 DIAGNOSIS — Z8619 Personal history of other infectious and parasitic diseases: Secondary | ICD-10-CM

## 2023-03-06 MED ORDER — ACYCLOVIR 800 MG PO TABS: 800.0000 mg | ORAL_TABLET | Freq: Two times a day (BID) | ORAL | 0 refills | Status: AC

## 2023-03-06 NOTE — Progress Notes (Signed)
We are sorry that you are not feeling well.  Here is how we plan to help!  Based on what you have shared with me it does look like you have a viral infection.    Most cold sores or fever blisters are small fluid filled blisters around the mouth caused by herpes simplex virus.  The most common strain of the virus causing cold sores is herpes simplex virus 1.  It can be spread by skin contact, sharing eating utensils, or even sharing towels.  Cold sores are contagious to other people until dry. (Approximately 5-7 days).  Wash your hands. You can spread the virus to your eyes through handling your contact lenses after touching the lesions.  Most people experience pain at the sight or tingling sensations in their lips that may begin before the ulcers erupt.  Herpes simplex is treatable but not curable.  It may lie dormant for a long time and then reappear due to stress or prolonged sun exposure.  Many patients have success in treating their cold sores with an over the counter topical called Abreva.  You may apply the cream up to 5 times daily (maximum 10 days) until healing occurs.  If you would like to use an oral antiviral medication to speed the healing of your cold sore, I have sent a prescription to your local pharmacy Acyclovir 800 mg take one by mouth twice a day for 7 days    HOME CARE:  Wash your hands frequently. Do not pick at or rub the sore. Don't open the blisters. Avoid kissing other people during this time. Avoid sharing drinking glasses, eating utensils, or razors. Do not handle contact lenses unless you have thoroughly washed your hands with soap and warm water! Avoid oral sex during this time.  Herpes from sores on your mouth can spread to your partner's genital area. Avoid contact with anyone who has eczema or a weakened immune system. Cold sores are often triggered by exposure to intense sunlight, use a lip balm containing a sunscreen (SPF 30 or higher).  GET HELP RIGHT AWAY  IF:  Blisters look infected. Blisters occur near or in the eye. Symptoms last longer than 10 days. Your symptoms become worse.  MAKE SURE YOU:  Understand these instructions. Will watch your condition. Will get help right away if you are not doing well or get worse.    Your e-visit answers were reviewed by a board certified advanced clinical practitioner to complete your personal care plan.  Depending upon the condition, your plan could have  Included both over the counter or prescription medications.    Please review your pharmacy choice.  Be sure that the pharmacy you have chosen is open so that you can pick up your prescription now.  If there is a problem you can message your provider in MyChart to have the prescription routed to another pharmacy.    Your safety is important to us.  If you have drug allergies check our prescription carefully.  For the next 24 hours you can use MyChart to ask questions about today's visit, request a non-urgent call back, or ask for a work or school excuse from your e-visit provider.  You will get an email in the next two days asking about your experience.  I hope that your e-visit has been valuable and will speed your recovery.   Meds ordered this encounter  Medications   acyclovir (ZOVIRAX) 800 MG tablet    Sig: Take 1 tablet (800 mg   total) by mouth 2 (two) times daily for 7 days.    Dispense:  14 tablet    Refill:  0    I spent approximately 5 minutes reviewing the patient's history, current symptoms and coordinating their care today.   

## 2023-03-08 ENCOUNTER — Telehealth: Payer: Self-pay | Admitting: Physician Assistant

## 2023-03-08 DIAGNOSIS — R051 Acute cough: Secondary | ICD-10-CM

## 2023-03-08 MED ORDER — PREDNISONE 20 MG PO TABS: 40.0000 mg | ORAL_TABLET | Freq: Every day | ORAL | 0 refills | Status: DC

## 2023-03-08 NOTE — Progress Notes (Signed)
E-Visit for Cough  We are sorry that you are not feeling well.  Here is how we plan to help!  Based on your presentation I believe you most likely have A cough due to a virus.  This is called viral bronchitis and is best treated by rest, plenty of fluids and control of the cough.  You may use Ibuprofen or Tylenol as directed to help your symptoms.     In addition you may use A non-prescription cough medication called Mucinex DM: take 2 tablets every 12 hours.  Prednisone 20mg  Take 2 tablets (40mg ) daily for 5 days  From your responses in the eVisit questionnaire you describe inflammation in the upper respiratory tract which is causing a significant cough.  This is commonly called Bronchitis and has four common causes:   Allergies Viral Infections Acid Reflux Bacterial Infection Allergies, viruses and acid reflux are treated by controlling symptoms or eliminating the cause. An example might be a cough caused by taking certain blood pressure medications. You stop the cough by changing the medication. Another example might be a cough caused by acid reflux. Controlling the reflux helps control the cough.  USE OF BRONCHODILATOR ("RESCUE") INHALERS: There is a risk from using your bronchodilator too frequently.  The risk is that over-reliance on a medication which only relaxes the muscles surrounding the breathing tubes can reduce the effectiveness of medications prescribed to reduce swelling and congestion of the tubes themselves.  Although you feel brief relief from the bronchodilator inhaler, your asthma may actually be worsening with the tubes becoming more swollen and filled with mucus.  This can delay other crucial treatments, such as oral steroid medications. If you need to use a bronchodilator inhaler daily, several times per day, you should discuss this with your provider.  There are probably better treatments that could be used to keep your asthma under control.     HOME CARE Only take  medications as instructed by your medical team. Complete the entire course of an antibiotic. Drink plenty of fluids and get plenty of rest. Avoid close contacts especially the very young and the elderly Cover your mouth if you cough or cough into your sleeve. Always remember to wash your hands A steam or ultrasonic humidifier can help congestion.   GET HELP RIGHT AWAY IF: You develop worsening fever. You become short of breath You cough up blood. Your symptoms persist after you have completed your treatment plan MAKE SURE YOU  Understand these instructions. Will watch your condition. Will get help right away if you are not doing well or get worse.    Thank you for choosing an e-visit.  Your e-visit answers were reviewed by a board certified advanced clinical practitioner to complete your personal care plan. Depending upon the condition, your plan could have included both over the counter or prescription medications.  Please review your pharmacy choice. Make sure the pharmacy is open so you can pick up prescription now. If there is a problem, you may contact your provider through [employer] and have the prescription routed to another pharmacy.  Your safety is important to [place]. If you have drug allergies check your prescription carefully.   For the next 24 hours you can use MyChart to ask questions about today's visit, request a non-urgent call back, or ask for a work or school excuse. You will get an email in the next two days asking about your experience. I hope that your e-visit has been valuable and will speed your  recovery.  I have spent 5 minutes in review of e-visit questionnaire, review and updating patient chart, medical decision making and response to patient.   Margaretann Loveless, PA-C

## 2023-03-22 ENCOUNTER — Telehealth: Payer: Self-pay | Admitting: Physician Assistant

## 2023-03-22 DIAGNOSIS — H6991 Unspecified Eustachian tube disorder, right ear: Secondary | ICD-10-CM

## 2023-03-22 MED ORDER — FLUTICASONE PROPIONATE 50 MCG/ACT NA SUSP: 2.0000 | Freq: Every day | NASAL | 0 refills | Status: DC

## 2023-03-22 NOTE — Progress Notes (Signed)
E-Visit for Ear Pain - Eustachian Tube Dysfunction   We are sorry that you are not feeling well. Here is how we plan to help!  Based on what you have shared with me it looks like you have Eustachian Tube Dysfunction.  Eustachian Tube Dysfunction is a condition where the tubes that connect your middle ears to your upper throat become blocked. This can lead to discomfort, hearing difficulties and a feeling of fullness in your ear. Eustachian tube dysfunction usually resolves itself in a few days. The usual symptoms include: Hearing problems Tinnitus, or ringing in your ears Clicking or popping sounds A feeling of fullness in your ears Pain that mimics an ear infection Dizziness, vertigo or balance problems A "tickling" sensation in your ears  ?Eustachian tube dysfunction symptoms may get worse in higher altitudes. This is called barotrauma, and it can happen while scuba diving, flying in an airplane or driving in the mountains.   What causes eustachian tube dysfunction? Allergies and infections (like the common cold and the flu) are the most common causes of eustachian tube dysfunction. These conditions can cause inflammation and mucus buildup, leading to blockage. GERD, or chronic acid reflux, can also cause ETD. This is because stomach acid can back up into your throat and result in inflammation. As mentioned above, altitude changes can also cause ETD.   What are some common eustachian tube dysfunction treatments? In most cases, treatment isn't necessary because ETD often resolves on its own. However, you might need treatment if your symptoms linger for more than two weeks.    Eustachian tube dysfunction treatment depends on the cause and the severity of your condition. Treatments may include home remedies, medications or, in severe cases, surgery.     HOME CARE: Sometimes simple home remedies can help with mild cases of eustachian tube dysfunction. To try and clear the blockage, you  can: Chew gum. Yawn. Swallow. Try the Valsalva maneuver (breathing out forcefully while closing your mouth and pinching your nostrils). Use a saline spray to clear out nasal passages.  MEDICATIONS: Over-the-counter medications can help if allergies are causing eustachian tube dysfunction. Try antihistamines (like cetirizine or diphenhydramine) to ease your symptoms. If you have discomfort, pain relievers -- such as acetaminophen or ibuprofen -- can help.  Sometimes intranasal glucocorticosteroids (like Flonase or Nasacort) help.  I have prescribed Fluticasone 50 mcg/spray 2 sprays in each nostril daily for 10-14 days    GET HELP RIGHT AWAY IF: Fever is over 102.2 degrees. You develop progressive ear pain or hearing loss. Ear symptoms persist longer than 3 days after treatment.  MAKE SURE YOU: Understand these instructions. Will watch your condition. Will get help right away if you are not doing well or get worse.  Thank you for choosing an e-visit.  Your e-visit answers were reviewed by a board certified advanced clinical practitioner to complete your personal care plan. Depending upon the condition, your plan could have included both over the counter or prescription medications.  Please review your pharmacy choice. Make sure the pharmacy is open so you can pick up the prescription now. If there is a problem, you may contact your provider through MyChart messaging and have the prescription routed to another pharmacy.  Your safety is important to us. If you have drug allergies check your prescription carefully.   For the next 24 hours you can use MyChart to ask questions about today's visit, request a non-urgent call back, or ask for a work or school excuse. You will   get an email with a survey after your eVisit asking about your experience. We would appreciate your feedback. I hope that your e-visit has been valuable and will aid in your recovery.  I have spent 5 minutes in review of  e-visit questionnaire, review and updating patient chart, medical decision making and response to patient.   Jennifer M Burnette, PA-C  

## 2023-03-26 ENCOUNTER — Telehealth: Payer: Self-pay | Admitting: Physician Assistant

## 2023-03-26 DIAGNOSIS — M5430 Sciatica, unspecified side: Secondary | ICD-10-CM

## 2023-03-26 MED ORDER — BACLOFEN 10 MG PO TABS: 10.0000 mg | ORAL_TABLET | Freq: Three times a day (TID) | ORAL | 0 refills | Status: DC

## 2023-03-26 MED ORDER — CYCLOBENZAPRINE HCL 10 MG PO TABS: 10.0000 mg | ORAL_TABLET | Freq: Three times a day (TID) | ORAL | 0 refills | Status: DC | PRN

## 2023-03-26 NOTE — Progress Notes (Signed)
I have spent 5 minutes in review of e-visit questionnaire, review and updating patient chart, medical decision making and response to patient.   William Cody Martin, PA-C    

## 2023-03-26 NOTE — Progress Notes (Signed)

## 2023-03-31 ENCOUNTER — Telehealth: Payer: Self-pay | Admitting: Nurse Practitioner

## 2023-03-31 DIAGNOSIS — K089 Disorder of teeth and supporting structures, unspecified: Secondary | ICD-10-CM

## 2023-03-31 MED ORDER — CHLORHEXIDINE GLUCONATE 0.12 % MT SOLN: 15.0000 mL | Freq: Two times a day (BID) | OROMUCOSAL | 0 refills | Status: DC

## 2023-03-31 NOTE — Progress Notes (Signed)
E-Visit for Dental Pain  We are sorry that you are not feeling well.  Here is how we plan to help!  Based on what you have shared with me in the questionnaire, I have sent an antibiotic mouthwash called peridex. I recommend using this for several days and if no improvement we can look into possible antibiotic pill.  Toothache does not always mean you need an antibiotic pill. Sometimes this can just be nerve pain from a tooth that needs to be either pulled or filled.   It is imperative that you see a dentist within 10 days of this eVisit to determine the cause of the dental pain and be sure it is adequately treated  A toothache or tooth pain is caused when the nerve in the root of a tooth or surrounding a tooth is irritated. Dental (tooth) infection, decay, injury, or loss of a tooth are the most common causes of dental pain. Pain may also occur after an extraction (tooth is pulled out). Pain sometimes originates from other areas and radiates to the jaw, thus appearing to be tooth pain.Bacteria growing inside your mouth can contribute to gum disease and dental decay, both of which can cause pain. A toothache occurs from inflammation of the central portion of the tooth called pulp. The pulp contains nerve endings that are very sensitive to pain. Inflammation to the pulp or pulpitis may be caused by dental cavities, trauma, and infection.    HOME CARE:   For toothaches: Over-the-counter pain medications such as acetaminophen or ibuprofen may be used. Take these as directed on the package while you arrange for a dental appointment. Avoid very cold or hot foods, because they may make the pain worse. You may get relief from biting on a cotton ball soaked in oil of cloves. You can get oil of cloves at most drug stores.  For jaw pain:  Aspirin may be helpful for problems in the joint of the jaw in adults. If pain happens every time you open your mouth widely, the temporomandibular joint (TMJ) may be the  source of the pain. Yawning or taking a large bite of food may worsen the pain. An appointment with your doctor or dentist will help you find the cause.     GET HELP RIGHT AWAY IF:  You have a high fever or chills If you have had a recent head or face injury and develop headache, light headedness, nausea, vomiting, or other symptoms that concern you after an injury to your face or mouth, you could have a more serious injury in addition to your dental injury. A facial rash associated with a toothache: This condition may improve with medication. Contact your doctor for them to decide what is appropriate. Any jaw pain occurring with chest pain: Although jaw pain is most commonly caused by dental disease, it is sometimes referred pain from other areas. People with heart disease, especially people who have had stents placed, people with diabetes, or those who have had heart surgery may have jaw pain as a symptom of heart attack or angina. If your jaw or tooth pain is associated with lightheadedness, sweating, or shortness of breath, you should see a doctor as soon as possible. Trouble swallowing or excessive pain or bleeding from gums: If you have a history of a weakened immune system, diabetes, or steroid use, you may be more susceptible to infections. Infections can often be more severe and extensive or caused by unusual organisms. Dental and gum infections in people  with these conditions may require more aggressive treatment. An abscess may need draining or IV antibiotics, for example.  MAKE SURE YOU   Understand these instructions. Will watch your condition. Will get help right away if you are not doing well or get worse.  Thank you for choosing an e-visit.  Your e-visit answers were reviewed by a board certified advanced clinical practitioner to complete your personal care plan. Depending upon the condition, your plan could have included both over the counter or prescription  medications.  Please review your pharmacy choice. Make sure the pharmacy is open so you can pick up prescription now. If there is a problem, you may contact your provider through [employer] and have the prescription routed to another pharmacy.  Your safety is important to [place]. If you have drug allergies check your prescription carefully.   For the next 24 hours you can use MyChart to ask questions about today's visit, request a non-urgent call back, or ask for a work or school excuse. You will get an email in the next two days asking about your experience. I hope that your e-visit has been valuable and will speed your recovery.

## 2023-03-31 NOTE — Progress Notes (Signed)
I have spent 5 minutes in review of e-visit questionnaire, review and updating patient chart, medical decision making and response to patient.  ° °Jerrell Mangel W Secilia Apps, NP ° °  °

## 2023-04-01 ENCOUNTER — Telehealth: Payer: Self-pay | Admitting: Physician Assistant

## 2023-04-01 DIAGNOSIS — J069 Acute upper respiratory infection, unspecified: Secondary | ICD-10-CM

## 2023-04-01 MED ORDER — FLUTICASONE PROPIONATE 50 MCG/ACT NA SUSP: 2.0000 | Freq: Every day | NASAL | 0 refills | Status: DC

## 2023-04-01 MED ORDER — NAPROXEN 500 MG PO TABS: 500.0000 mg | ORAL_TABLET | Freq: Two times a day (BID) | ORAL | 0 refills | Status: DC

## 2023-04-01 MED ORDER — PROMETHAZINE-DM 6.25-15 MG/5ML PO SYRP: 5.0000 mL | ORAL_SOLUTION | Freq: Four times a day (QID) | ORAL | 0 refills | Status: DC | PRN

## 2023-04-01 NOTE — Progress Notes (Signed)
E-Visit for [employer] Virus / COVID Screening  Your current symptoms could be consistent with COVID.  Please complete a Covid test either at home or check with your local pharmacy to see if they provide testing.    You have tested positive for COVID-19, meaning that you were infected with the novel coronavirus and could give the virus to others.  Most people with COVID-19 have mild illness and can recover at home without medical care. Do not leave your home, except to get medical care. Do not visit public areas and do not go to places where you are unable to wear a mask. It is important that you stay home  to take care for yourself and to help protect other people in your home and community.      Isolation Instructions:   You are to isolate at home until you have been fever free for at least 24 hours without a fever-reducing medication, and symptoms have been steadily improving for 24 hours. At that time,  you can end isolation but need to mask for an additional 5 days.  If you must be around other household members who do not have symptoms, you need to make sure that both you and the family members are masking consistently with a high-quality mask.  If you note any worsening of symptoms despite treatment, please seek an in-person evaluation ASAP. If you note any significant shortness of breath or any chest pain, please seek ER evaluation. Please do not delay care!  Go to the nearest hospital ED for assessment if fever/cough/breathlessness are severe or illness seems like a threat to life.    The following symptoms may appear 2-14 days after exposure: Fever Cough Shortness of breath or difficulty breathing Chills Repeated shaking with chills Muscle pain Headache Sore throat New loss of taste or smell Fatigue Congestion or runny nose Nausea or vomiting Diarrhea  You can use medication such as prescription cough medication called Phenergan DM 6.25 mg/15 mg. You make take one teaspoon / 5 ml  every 4-6 hours as needed for cough, prescription anti-inflammatory called Naprosyn 500 mg. Take twice daily as needed for fever or body aches for 2 weeks, and prescription for Fluticasone nasal spray 2 sprays in each nostril one time per day  You may also take acetaminophen (Tylenol) as needed for fever.  HOME CARE Only take medications as instructed by your medical team. Drink plenty of fluids and get plenty of rest. A steam or ultrasonic humidifier can help if you have congestion.  GET HELP RIGHT AWAY IF YOU HAVE EMERGENCY WARNING SIGNS.  Call 911 or proceed to your closest emergency facility if: You develop worsening high fever. Trouble breathing Bluish lips or face Persistent pain or pressure in the chest New confusion Inability to wake or stay awake You cough up blood. Your symptoms become more severe Inability to hold down food or fluids  This list is not all possible symptoms. Contact your medical provider for any symptoms that are severe or concerning to you.   Your e-visit answers were reviewed by a board certified advanced clinical practitioner to complete your personal care plan.  Depending on the condition, your plan could have included both over the counter or prescription medications.  If there is a problem, please reply once you have received a response from your provider.  Your safety is important to [place].  If you have drug allergies check your prescription carefully.    You can use MyChart to ask questions about  today's visit, request a non-urgent call back, or ask for a work or school excuse for 24 hours related to this e-Visit. If it has been greater than 24 hours you will need to follow up with your provider or enter a new e-Visit to address those concerns. You will get an e-mail in the next two days asking about your experience.  I hope that your e-visit has been valuable and will speed your recovery. Thank you for using e-visits.    I have spent 5 minutes in  review of e-visit questionnaire, review and updating patient chart, medical decision making and response to patient.   Margaretann Loveless, PA-C

## 2023-05-09 ENCOUNTER — Telehealth: Payer: Self-pay | Admitting: Physician Assistant

## 2023-05-09 DIAGNOSIS — J029 Acute pharyngitis, unspecified: Secondary | ICD-10-CM

## 2023-05-09 NOTE — Progress Notes (Signed)

## 2023-05-11 ENCOUNTER — Telehealth: Payer: BLUE CROSS/BLUE SHIELD | Admitting: Family Medicine

## 2023-05-11 DIAGNOSIS — R059 Cough, unspecified: Secondary | ICD-10-CM

## 2023-05-11 DIAGNOSIS — J4520 Mild intermittent asthma, uncomplicated: Secondary | ICD-10-CM | POA: Diagnosis not present

## 2023-05-11 DIAGNOSIS — J208 Acute bronchitis due to other specified organisms: Secondary | ICD-10-CM | POA: Diagnosis not present

## 2023-05-11 DIAGNOSIS — J069 Acute upper respiratory infection, unspecified: Secondary | ICD-10-CM | POA: Diagnosis not present

## 2023-05-11 DIAGNOSIS — R051 Acute cough: Secondary | ICD-10-CM

## 2023-05-11 MED ORDER — PREDNISONE 20 MG PO TABS: 40.0000 mg | ORAL_TABLET | Freq: Every day | ORAL | 0 refills | Status: DC

## 2023-05-11 MED ORDER — BENZONATATE 200 MG PO CAPS: 200.0000 mg | ORAL_CAPSULE | Freq: Two times a day (BID) | ORAL | 0 refills | Status: DC | PRN

## 2023-05-11 NOTE — Progress Notes (Signed)

## 2023-07-11 ENCOUNTER — Telehealth: Payer: BLUE CROSS/BLUE SHIELD | Admitting: Nurse Practitioner

## 2023-07-11 DIAGNOSIS — J329 Chronic sinusitis, unspecified: Secondary | ICD-10-CM

## 2023-07-11 DIAGNOSIS — B9789 Other viral agents as the cause of diseases classified elsewhere: Secondary | ICD-10-CM | POA: Diagnosis not present

## 2023-07-11 NOTE — Progress Notes (Signed)
E-Visit for Sinus Problems  We are sorry that you are not feeling well.  Here is how we plan to help!  Based on what you have shared with me it looks like you have sinusitis.  Sinusitis is inflammation and infection in the sinus cavities of the head.  Based on your presentation I believe you most likely have Acute Viral Sinusitis.This is an infection most likely caused by a virus. There is not specific treatment for viral sinusitis other than to help you with the symptoms until the infection runs its course.  You may use an oral decongestant such as plain Mucinex. (You should avoid strong decongestants, like ones with sudafed due to your high blood pressure)  Saline nasal spray help and can safely be used as often as needed for congestion,   We did not send in the nasal spray we typically recommend as you noted it does not work for you. We do not recommend antibiotics prior to 7-10 days of consistent symptoms. Providers prescribe antibiotics to treat infections caused by bacteria. Antibiotics are very powerful in treating bacterial infections when they are used properly. To maintain their effectiveness, they should be used only when necessary. Overuse of antibiotics has resulted in the development of superbugs that are resistant to treatment!    After careful review of your answers, I would not recommend an antibiotic for your condition.  Antibiotics are not effective against viruses and therefore should not be used to treat them. Common examples of infections caused by viruses include colds and flu   Some authorities believe that zinc sprays or the use of Echinacea may shorten the course of your symptoms.  Sinus infections are not as easily transmitted as other respiratory infection, however we still recommend that you avoid close contact with loved ones, especially the very young and elderly.  Remember to wash your hands thoroughly throughout the day as this is the number one way to prevent the spread  of infection!  Home Care: Only take medications as instructed by your medical team. Do not take these medications with alcohol. A steam or ultrasonic humidifier can help congestion.  You can place a towel over your head and breathe in the steam from hot water coming from a faucet. Avoid close contacts especially the very young and the elderly. Cover your mouth when you cough or sneeze. Always remember to wash your hands.  Get Help Right Away If: You develop worsening fever or sinus pain. You develop a severe head ache or visual changes. Your symptoms persist after you have completed your treatment plan.  Make sure you Understand these instructions. Will watch your condition. Will get help right away if you are not doing well or get worse.   Thank you for choosing an e-visit.  Your e-visit answers were reviewed by a board certified advanced clinical practitioner to complete your personal care plan. Depending upon the condition, your plan could have included both over the counter or prescription medications.  Please review your pharmacy choice. Make sure the pharmacy is open so you can pick up prescription now. If there is a problem, you may contact your provider through [employer] and have the prescription routed to another pharmacy.  Your safety is important to [place]. If you have drug allergies check your prescription carefully.   For the next 24 hours you can use MyChart to ask questions about today's visit, request a non-urgent call back, or ask for a work or school excuse. You will get an  email in the next two days asking about your experience. I hope that your e-visit has been valuable and will speed your recovery.  I spent approximately 5 minutes reviewing the patient's history, current symptoms and coordinating their care today.

## 2023-07-16 ENCOUNTER — Ambulatory Visit
Admission: RE | Admit: 2023-07-16 | Discharge: 2023-07-16 | Disposition: A | Discharge status: Home / Self Care | Payer: BLUE CROSS/BLUE SHIELD | Source: Ambulatory Visit | Attending: Family Medicine | Admitting: Family Medicine

## 2023-07-16 VITALS — BP 151/89 | HR 88 | Temp 98.9°F | Resp 17

## 2023-07-16 DIAGNOSIS — R5383 Other fatigue: Secondary | ICD-10-CM | POA: Diagnosis not present

## 2023-07-16 DIAGNOSIS — I10 Essential (primary) hypertension: Secondary | ICD-10-CM

## 2023-07-16 LAB — POCT FASTING CBG KUC MANUAL ENTRY: POCT Glucose (KUC): 119 mg/dL — AB (ref 70–99)

## 2023-07-16 MED ORDER — LOSARTAN POTASSIUM 50 MG PO TABS: 50.0000 mg | ORAL_TABLET | Freq: Every day | ORAL | 0 refills | Status: DC

## 2023-07-16 NOTE — Discharge Instructions (Signed)
Take your blood pressure medicine daily Make an appointment to see your primary care doctor in follow-up

## 2023-07-16 NOTE — ED Provider Notes (Addendum)
 [name]    CSN: [id number] Arrival date & time: 07/16/23  1528      History   Chief Complaint Chief Complaint  Patient presents with   Fatigue    Entered by patient    HPI Benjamin Meyer is a 47 y.o. male.   HPI  Review of the medical chart it appears this patient has gone through the last couple of years with E- visits.  He states that he has not been to a primary care doctor for some time.  He admits that he is off of his blood pressure medication.  He previously took Cozaar  with good results.  He is here because he has been having increased fatigue.  Feels like he has increased urination.  Is concerned about diabetes.  It runs in his family. He has not on any particular diet.  He is obese.  He understands he needs more exercise. Is not having any headache, chest pain, shortness of breath, pedal edema  Past Medical History:  Diagnosis Date   Gout    Hypertension     There are no active problems to display for this patient.   History reviewed. No pertinent surgical history.     Home Medications    Prior to Admission medications   Medication Sig Start Date End Date Taking? Authorizing Provider  losartan  (COZAAR ) 50 MG tablet Take 1 tablet (50 mg total) by mouth daily. 07/16/23  Yes Maranda Jamee Jacob, MD    Family History Family History  Problem Relation Age of Onset   Hypertension Mother    Diabetes Mother    Heart failure Father    Hypertension Father     Social History Social History   Tobacco Use   Smoking status: Never   Smokeless tobacco: Never  Vaping Use   Vaping status: Never Used  Substance Use Topics   Alcohol use: Yes    Comment: occasionally   Drug use: No     Allergies   Lisinopril    Review of Systems Review of Systems See HPI  Physical Exam Triage Vital Signs ED Triage Vitals  Encounter Vitals Group     BP 07/16/23 1635 (!) 195/100     Systolic BP Percentile --      Diastolic BP Percentile --       Pulse --      Resp --      Temp --      Temp src --      SpO2 --      Weight --      Height --      Head Circumference --      Peak Flow --      Pain Score 07/16/23 1631 0     Pain Loc --      Pain Education --      Exclude from Growth Chart --    No data found.  Updated Vital Signs BP (!) 151/89 (BP Location: Left Arm)   Pulse 88   Temp 98.9 F (37.2 C)   Resp 17   SpO2 94%       Physical Exam Constitutional:      General: He is not in acute distress.    Appearance: He is well-developed. He is obese.  HENT:     Head: Normocephalic and atraumatic.  Eyes:     Conjunctiva/sclera: Conjunctivae normal.     Pupils: Pupils are equal, round, and reactive to light.  Cardiovascular:     Rate  and Rhythm: Normal rate.     Heart sounds: Normal heart sounds.  Pulmonary:     Effort: Pulmonary effort is normal. No respiratory distress.     Breath sounds: Normal breath sounds.     Comments: Breath sounds diminished due to body habitus Abdominal:     General: There is no distension.     Palpations: Abdomen is soft.  Musculoskeletal:        General: Normal range of motion.     Cervical back: Normal range of motion.  Skin:    General: Skin is warm and dry.  Neurological:     Mental Status: He is alert.      UC Treatments / Results  Labs (all labs ordered are listed, but only abnormal results are displayed) Labs Reviewed  POCT FASTING CBG [facility] MANUAL ENTRY - Abnormal; Notable for the following components:      Result Value   POCT Glucose ([facility]) 119 (*)    All other components within normal limits  CBC WITH DIFFERENTIAL/PLATELET  COMPREHENSIVE METABOLIC PANEL  HEMOGLOBIN A1C    EKG   Radiology No results found.  Procedures Procedures (including critical care time)  Medications Ordered in UC Medications - No data to display  Initial Impression / Assessment and Plan / UC Course  I have reviewed the triage vital signs and the nursing notes.  Pertinent labs &  imaging results that were available during my care of the patient were reviewed by me and considered in my medical decision making (see chart for details).     Patient needs to reestablish with primary care.  Will draw some screening blood work and give him a referral Final Clinical Impressions(s) / UC Diagnoses   Final diagnoses:  Other fatigue  Uncontrolled hypertension     Discharge Instructions      Take your blood pressure medicine daily Make an appointment to see your primary care doctor in follow-up      ED Prescriptions     Medication Sig Dispense Auth. Provider   losartan  (COZAAR ) 50 MG tablet Take 1 tablet (50 mg total) by mouth daily. 90 tablet Maranda Jamee Jacob, MD      PDMP not reviewed this encounter.   Maranda Jamee Jacob, MD 07/16/23 1723    Maranda Jamee Jacob, MD 07/16/23 [id number]

## 2023-07-16 NOTE — ED Triage Notes (Addendum)
 Pt presents to [facility] with co of fatigue, lightheaded since christmas. Pt denies and cough congestion or fevers. Pt has pmh of htn but has not seen his pcp in 2 months and has not been able to get a refill because he is in the process of finding a new pcp.  Pt reports he has been using a friends losartain-htcz (50-12.5) for bp management in the meantime.    Pt is also concerned his sugars could be low or high since he has been dealing with this fatigue and diabetes runs in his family. Pt reports urinary frequency and dry mouth, but denies polydipsia.

## 2023-07-17 LAB — CBC WITH DIFFERENTIAL/PLATELET
Basophils Absolute: 0 10*3/uL (ref 0.0–0.2)
Basos: 0 %
EOS (ABSOLUTE): 0.2 10*3/uL (ref 0.0–0.4)
Eos: 2 %
Hematocrit: 54.5 % — ABNORMAL HIGH (ref 37.5–51.0)
Hemoglobin: 17.5 g/dL (ref 13.0–17.7)
Immature Grans (Abs): 0 10*3/uL (ref 0.0–0.1)
Immature Granulocytes: 0 %
Lymphocytes Absolute: 1.9 10*3/uL (ref 0.7–3.1)
Lymphs: 19 %
MCH: 28.9 pg (ref 26.6–33.0)
MCHC: 32.1 g/dL (ref 31.5–35.7)
MCV: 90 fL (ref 79–97)
Monocytes Absolute: 0.8 10*3/uL (ref 0.1–0.9)
Monocytes: 9 %
Neutrophils Absolute: 6.7 10*3/uL (ref 1.4–7.0)
Neutrophils: 70 %
Platelets: 227 10*3/uL (ref 150–450)
RBC: 6.05 x10E6/uL — ABNORMAL HIGH (ref 4.14–5.80)
RDW: 13.8 % (ref 11.6–15.4)
WBC: 9.6 10*3/uL (ref 3.4–10.8)

## 2023-07-17 LAB — COMPREHENSIVE METABOLIC PANEL
ALT: 29 [IU]/L (ref 0–44)
AST: 35 [IU]/L (ref 0–40)
Albumin: 4.3 g/dL (ref 4.1–5.1)
Alkaline Phosphatase: 80 [IU]/L (ref 44–121)
BUN/Creatinine Ratio: 10 (ref 9–20)
BUN: 15 mg/dL (ref 6–24)
Bilirubin Total: 0.3 mg/dL (ref 0.0–1.2)
CO2: 20 mmol/L (ref 20–29)
Calcium: 9.3 mg/dL (ref 8.7–10.2)
Chloride: 97 mmol/L (ref 96–106)
Creatinine, Ser: 1.48 mg/dL — ABNORMAL HIGH (ref 0.76–1.27)
Globulin, Total: 3.5 g/dL (ref 1.5–4.5)
Glucose: 102 mg/dL — ABNORMAL HIGH (ref 70–99)
Potassium: 4.5 mmol/L (ref 3.5–5.2)
Sodium: 137 mmol/L (ref 134–144)
Total Protein: 7.8 g/dL (ref 6.0–8.5)
eGFR: 58 mL/min/{1.73_m2} — ABNORMAL LOW (ref >59)

## 2023-07-17 LAB — HEMOGLOBIN A1C
Est. average glucose Bld gHb Est-mCnc: 169 mg/dL
Hgb A1c MFr Bld: 7.5 % — ABNORMAL HIGH (ref 4.8–5.6)

## 2023-07-19 ENCOUNTER — Telehealth (HOSPITAL_COMMUNITY): Payer: Self-pay | Admitting: Emergency Medicine

## 2023-07-19 ENCOUNTER — Telehealth: Payer: BLUE CROSS/BLUE SHIELD | Admitting: Family Medicine

## 2023-07-19 DIAGNOSIS — M109 Gout, unspecified: Secondary | ICD-10-CM | POA: Diagnosis not present

## 2023-07-19 MED ORDER — METFORMIN HCL 500 MG PO TABS: 500.0000 mg | ORAL_TABLET | Freq: Two times a day (BID) | ORAL | 0 refills | Status: AC

## 2023-07-19 MED ORDER — INDOMETHACIN 50 MG PO CAPS: 50.0000 mg | ORAL_CAPSULE | Freq: Three times a day (TID) | ORAL | 0 refills | Status: DC

## 2023-07-19 NOTE — Progress Notes (Signed)
E-Visit for Gout Symptoms  We are sorry that you are not feeling well. We are here to help!  Based on what you shared with me it looks like you have a flare of your gout.  Gout is a form of arthritis. It can cause pain and swelling in the joints. At first, it tends to affect only 1 joint - most frequently the big toe. It happens in people who have too much uric acid in the blood. Uric acid is a chemical that is produced when the body breaks down certain foods. Uric acid can form sharp needle-like crystals that build up in the joints and cause pain. Uric acid crystals can also form inside the tubes that carry urine from the kidneys to the bladder. These crystals can turn into "kidney stones" that can cause pain and problems with the flow of urine. People with gout get sudden "flares" or attacks of severe pain, most often the big toe, ankle, or knee. Often the joint also turns red and swells. Usually, only 1 joint is affected, but some people have pain in more than 1 joint. Gout flares tend to happen more often during the night.  The pain from gout can be extreme. The pain and swelling are worst at the beginning of a gout flare. The symptoms then get better within a few days to weeks. It is not clear how the body "turns off" a gout flare.  Do not start any NEW preventative medicine until the gout has cleared completely. However, If you are already on Probenecid or Allopurinol for CHRONIC gout, you may continue taking this during an active flare up  I have prescribed Indomethacin 50mg three times daily for moderate to severe pain for no more than 7 days   HOME CARE Losing weight can help relieve gout. It's not clear that following a specific diet plan will help with gout symptoms but eating a balanced diet can help improve your overall health. It can also help you lose weight, if you are overweight. In general, a healthy diet includes plenty of fruits, vegetables, whole grains, and low-fat dairy  products (labelled "low fat", skim, 2%). Avoid sugar sweetened drinks (including sodas, tea, juice and juice blends, coffee drinks and sports drinks) Limit alcohol to 1-2 drinks of beer, spirits or wine daily these can make gout flares worse. Some people with gout also have other health problems, such as heart disease, high blood pressure, kidney disease, or obesity. If you have any of these issues, it's important to work with your doctor to manage them. This can help improve your overall health and might also help with your gout.  GET HELP RIGHT AWAY IF: Your symptoms persist after you have completed your treatment plan You develop severe diarrhea You develop abnormal sensations  You develop vomiting,   You develop weakness  You develop abdominal pain  FOLLOW UP WITH YOUR PRIMARY PROVIDER IF: If your symptoms do not improve within 10 days  MAKE SURE YOU  Understand these instructions. Will watch your condition. Will get help right away if you are not doing well or get worse.  Thank you for choosing an e-visit.  Your e-visit answers were reviewed by a board certified advanced clinical practitioner to complete your personal care plan. Depending upon the condition, your plan could have included both over the counter or prescription medications.  Please review your pharmacy choice. Make sure the pharmacy is open so you can pick up prescription now. If there is a problem,   you may contact your provider through MyChart messaging and have the prescription routed to another pharmacy.  Your safety is important to us. If you have drug allergies check your prescription carefully.   For the next 24 hours you can use MyChart to ask questions about today's visit, request a non-urgent call back, or ask for a work or school excuse. You will get an email in the next two days asking about your experience. I hope that your e-visit has been valuable and will speed your recovery.    have provided 5 minutes of  non face to face time during this encounter for chart review and documentation.   

## 2023-07-19 NOTE — Telephone Encounter (Signed)
 Metformin, per [facility] APP due to A1C

## 2023-07-20 ENCOUNTER — Encounter: Payer: BLUE CROSS/BLUE SHIELD | Admitting: Nurse Practitioner

## 2023-07-20 DIAGNOSIS — M109 Gout, unspecified: Secondary | ICD-10-CM

## 2023-07-20 MED ORDER — COLCHICINE 0.6 MG PO TABS: ORAL_TABLET | ORAL | 0 refills | Status: DC

## 2023-07-20 NOTE — Progress Notes (Signed)
Duplicate evisit 

## 2023-07-20 NOTE — Progress Notes (Signed)
 I have spent 5 minutes in review of e-visit questionnaire, review and updating patient chart, medical decision making and response to patient.   Claiborne Rigg, NP

## 2023-07-28 ENCOUNTER — Telehealth: Payer: BLUE CROSS/BLUE SHIELD | Admitting: Family Medicine

## 2023-07-28 DIAGNOSIS — M109 Gout, unspecified: Secondary | ICD-10-CM | POA: Diagnosis not present

## 2023-07-28 MED ORDER — COLCHICINE 0.6 MG PO TABS: ORAL_TABLET | ORAL | 0 refills | Status: DC

## 2023-07-28 MED ORDER — INDOMETHACIN 50 MG PO CAPS: 50.0000 mg | ORAL_CAPSULE | Freq: Three times a day (TID) | ORAL | 0 refills | Status: AC

## 2023-07-28 NOTE — Progress Notes (Signed)
 E-Visit for Gout Symptoms  We are sorry that you are not feeling well. We are here to help!  Based on what you shared with me it looks like you have a flare of your gout.  Gout is a form of arthritis. It can cause pain and swelling in the joints. At first, it tends to affect only 1 joint - most frequently the big toe. It happens in people who have too much uric acid in the blood. Uric acid is a chemical that is produced when the body breaks down certain foods. Uric acid can form sharp needle-like crystals that build up in the joints and cause pain. Uric acid crystals can also form inside the tubes that carry urine from the kidneys to the bladder. These crystals can turn into kidney stones that can cause pain and problems with the flow of urine. People with gout get sudden flares or attacks of severe pain, most often the big toe, ankle, or knee. Often the joint also turns red and swells. Usually, only 1 joint is affected, but some people have pain in more than 1 joint. Gout flares tend to happen more often during the night.  The pain from gout can be extreme. The pain and swelling are worst at the beginning of a gout flare. The symptoms then get better within a few days to weeks. It is not clear how the body turns off a gout flare.  Do not start any NEW preventative medicine until the gout has cleared completely. However, If you are already on Probenecid or Allopurinol for CHRONIC gout, you may continue taking this during an active flare up  I have prescribed Indomethacin  50mg  three times daily for moderate to severe pain for no more than 7 days and I have prescribed Colchicine  0.6 mg tabs - Take 2 tabs immediately, then 1 tab twice per day for the duration of the flare up to a max of 7 days (but discontinue for stomach pains or diarrhea)    HOME CARE Losing weight can help relieve gout. It's not clear that following a specific diet plan will help with gout symptoms but eating a balanced diet can  help improve your overall health. It can also help you lose weight, if you are overweight. In general, a healthy diet includes plenty of fruits, vegetables, whole grains, and low-fat dairy products (labelled "low fat", skim, 2%). Avoid sugar sweetened drinks (including sodas, tea, juice and juice blends, coffee drinks and sports drinks) Limit alcohol to 1-2 drinks of beer, spirits or wine daily these can make gout flares worse. Some people with gout also have other health problems, such as heart disease, high blood pressure, kidney disease, or obesity. If you have any of these issues, it's important to work with your doctor to manage them. This can help improve your overall health and might also help with your gout.  GET HELP RIGHT AWAY IF: Your symptoms persist after you have completed your treatment plan You develop severe diarrhea You develop abnormal sensations  You develop vomiting,   You develop weakness  You develop abdominal pain  FOLLOW UP WITH YOUR PRIMARY PROVIDER IF: If your symptoms do not improve within 10 days  MAKE SURE YOU  Understand these instructions. Will watch your condition. Will get help right away if you are not doing well or get worse.  Thank you for choosing an e-visit.  Your e-visit answers were reviewed by a board certified advanced clinical practitioner to complete your personal care plan.  Depending upon the condition, your plan could have included both over the counter or prescription medications.  Please review your pharmacy choice. Make sure the pharmacy is open so you can pick up prescription now. If there is a problem, you may contact your provider through [employer] and have the prescription routed to another pharmacy.  Your safety is important to us . If you have drug allergies check your prescription carefully.   For the next 24 hours you can use MyChart to ask questions about today's visit, request a non-urgent call back, or ask for a work or  school excuse. You will get an email in the next two days asking about your experience. I hope that your e-visit has been valuable and will speed your recovery.

## 2023-08-19 ENCOUNTER — Telehealth: Payer: Self-pay | Admitting: Family Medicine

## 2023-08-19 DIAGNOSIS — J029 Acute pharyngitis, unspecified: Secondary | ICD-10-CM

## 2023-08-19 DIAGNOSIS — Z20818 Contact with and (suspected) exposure to other bacterial communicable diseases: Secondary | ICD-10-CM

## 2023-08-19 MED ORDER — AMOXICILLIN 875 MG PO TABS: 875.0000 mg | ORAL_TABLET | Freq: Two times a day (BID) | ORAL | 0 refills | Status: AC

## 2023-08-19 NOTE — Progress Notes (Signed)
 E-Visit for Sore Throat - Strep Symptoms  We are sorry that you are not feeling well.  Here is how we plan to help!  Based on what you have shared with me it is likely that you have strep pharyngitis.  Strep pharyngitis is inflammation and infection in the back of the throat.  This is an infection cause by bacteria and is treated with antibiotics.  I have prescribed Amoxicillin 500 mg twice a day for 10 days. For throat pain, we recommend over the counter oral pain relief medications such as acetaminophen or aspirin, or anti-inflammatory medications such as ibuprofen or naproxen sodium. Topical treatments such as oral throat lozenges or sprays may be used as needed. Strep infections are not as easily transmitted as other respiratory infections, however we still recommend that you avoid close contact with loved ones, especially the very young and elderly.  Remember to wash your hands thoroughly throughout the day as this is the number one way to prevent the spread of infection and wipe down door knobs and counters with disinfectant.   Home Care: Only take medications as instructed by your medical team. Complete the entire course of an antibiotic. Do not take these medications with alcohol. A steam or ultrasonic humidifier can help congestion.  You can place a towel over your head and breathe in the steam from hot water coming from a faucet. Avoid close contacts especially the very young and the elderly. Cover your mouth when you cough or sneeze. Always remember to wash your hands.  Get Help Right Away If: You develop worsening fever or sinus pain. You develop a severe head ache or visual changes. Your symptoms persist after you have completed your treatment plan.  Make sure you Understand these instructions. Will watch your condition. Will get help right away if you are not doing well or get worse.   Thank you for choosing an e-visit.  Your e-visit answers were reviewed by a board  certified advanced clinical practitioner to complete your personal care plan. Depending upon the condition, your plan could have included both over the counter or prescription medications.  Please review your pharmacy choice. Make sure the pharmacy is open so you can pick up prescription now. If there is a problem, you may contact your provider through [employer] and have the prescription routed to another pharmacy.  Your safety is important to [place]. If you have drug allergies check your prescription carefully.   For the next 24 hours you can use MyChart to ask questions about today's visit, request a non-urgent call back, or ask for a work or school excuse. You will get an email in the next two days asking about your experience. I hope that your e-visit has been valuable and will speed your recovery.    have provided 5 minutes of non face to face time during this encounter for chart review and documentation.

## 2023-08-25 ENCOUNTER — Telehealth: Payer: Self-pay | Admitting: Family

## 2023-08-25 DIAGNOSIS — B372 Candidiasis of skin and nail: Secondary | ICD-10-CM

## 2023-08-25 MED ORDER — NYSTATIN 100000 UNIT/GM EX CREA: 1.0000 | TOPICAL_CREAM | Freq: Two times a day (BID) | CUTANEOUS | 0 refills | Status: AC

## 2023-08-25 NOTE — Progress Notes (Signed)

## 2023-09-04 ENCOUNTER — Telehealth: Payer: Self-pay | Admitting: Physician Assistant

## 2023-09-04 DIAGNOSIS — R3989 Other symptoms and signs involving the genitourinary system: Secondary | ICD-10-CM

## 2023-09-04 NOTE — Progress Notes (Signed)
 E-Visit for Urinary Problems ? ?Based on what you shared with me, I feel your condition warrants further evaluation and I recommend that you be seen for a face to face office visit.  Male bladder infections are not very common.  We worry about prostate or kidney conditions.  The standard of care is to examine the abdomen and kidneys, and to do a urine and blood test to make sure that something more serious is not going on.  We recommend that you see a provider today.  If your doctor's office is closed [place] has the following Urgent Cares: ? ?  ?NOTE: You will not be charged for this e-visit. ? ?If you are having a true medical emergency please call 911.   ? ?  ? For an urgent face to face visit, [place] has six urgent care centers for your convenience:  ?  ? [facility] ?Get Driving Directions ?[phone] ?[phone] ?[place], [place] [address] ?  ? [facility]) ?Get Driving Directions ?[phone] ?[address] ?[place], [place] [address] ? ?[facility]) ?Get Driving Directions ?[phone] ?[address] ?[place],  [place]  [address] ? ?[facility] ?Get Driving Directions ?[phone] ?[address], [address] ?[place], [place] [address] ?  ?[facility] [place] ?Get Driving Directions  ?[phone] ?[address] ?[address] ?[place], [place] [address] ?  ? Urgent Care at [facility] ?Get Driving Directions ?[phone] ?[address] ?[place], [place] [address] ? ?Your MyChart E-visit questionnaire answers were reviewed by a board certified advanced clinical practitioner to complete your personal care plan based on your specific symptoms.  Thank you for using e-Visits. ?

## 2023-09-20 ENCOUNTER — Telehealth: Payer: Self-pay | Admitting: Physician Assistant

## 2023-09-20 DIAGNOSIS — S39012A Strain of muscle, fascia and tendon of lower back, initial encounter: Secondary | ICD-10-CM

## 2023-09-20 DIAGNOSIS — M5431 Sciatica, right side: Secondary | ICD-10-CM

## 2023-09-20 MED ORDER — CYCLOBENZAPRINE HCL 10 MG PO TABS
5.0000 mg | ORAL_TABLET | Freq: Three times a day (TID) | ORAL | 0 refills | Status: DC | PRN
Start: 2023-09-20 — End: 2023-09-20

## 2023-09-20 MED ORDER — BACLOFEN 10 MG PO TABS: 10.0000 mg | ORAL_TABLET | Freq: Three times a day (TID) | ORAL | 0 refills | Status: AC

## 2023-09-20 MED ORDER — NAPROXEN 500 MG PO TABS
500.0000 mg | ORAL_TABLET | Freq: Two times a day (BID) | ORAL | 0 refills | Status: AC
Start: 2023-09-20 — End: ?

## 2023-09-20 NOTE — Progress Notes (Signed)

## 2023-09-20 NOTE — Addendum Note (Signed)
 Addended by: Margaretann Loveless on: 09/20/2023 01:01 PM   Modules accepted: Orders

## 2023-10-05 ENCOUNTER — Telehealth: Payer: Self-pay | Admitting: Nurse Practitioner

## 2023-10-05 DIAGNOSIS — J020 Streptococcal pharyngitis: Secondary | ICD-10-CM

## 2023-10-05 MED ORDER — AMOXICILLIN 500 MG PO CAPS: 500.0000 mg | ORAL_CAPSULE | Freq: Two times a day (BID) | ORAL | 0 refills | Status: AC

## 2023-10-05 NOTE — Progress Notes (Signed)

## 2023-10-05 NOTE — Progress Notes (Signed)
 I have spent 5 minutes in review of e-visit questionnaire, review and updating patient chart, medical decision making and response to patient.   Claiborne Rigg, NP

## 2023-10-09 ENCOUNTER — Telehealth: Payer: Self-pay | Admitting: Physician Assistant

## 2023-10-09 DIAGNOSIS — J069 Acute upper respiratory infection, unspecified: Secondary | ICD-10-CM

## 2023-10-09 MED ORDER — BENZONATATE 100 MG PO CAPS: 100.0000 mg | ORAL_CAPSULE | Freq: Three times a day (TID) | ORAL | 0 refills | Status: AC | PRN

## 2023-10-09 MED ORDER — FLUTICASONE PROPIONATE 50 MCG/ACT NA SUSP
2.0000 | Freq: Every day | NASAL | 0 refills | Status: AC
Start: 2023-10-09 — End: ?

## 2023-10-09 NOTE — Progress Notes (Signed)
E-Visit for Upper Respiratory Infection   We are sorry you are not feeling well.  Here is how we plan to help!  Based on what you have shared with me, it looks like you may have a viral upper respiratory infection.  Upper respiratory infections are caused by a large number of viruses; however, rhinovirus is the most common cause.   Symptoms vary from person to person, with common symptoms including sore throat, cough, fatigue or lack of energy and feeling of general discomfort.  A low-grade fever of up to 100.4 may present, but is often uncommon.  Symptoms vary however, and are closely related to a person's age or underlying illnesses.  The most common symptoms associated with an upper respiratory infection are nasal discharge or congestion, cough, sneezing, headache and pressure in the ears and face.  These symptoms usually persist for about 3 to 10 days, but can last up to 2 weeks.  It is important to know that upper respiratory infections do not cause serious illness or complications in most cases.    Upper respiratory infections can be transmitted from person to person, with the most common method of transmission being a person's hands.  The virus is able to live on the skin and can infect other persons for up to 2 hours after direct contact.  Also, these can be transmitted when someone coughs or sneezes; thus, it is important to cover the mouth to reduce this risk.  To keep the spread of the illness at bay, good hand hygiene is very important.  This is an infection that is most likely caused by a virus. There are no specific treatments other than to help you with the symptoms until the infection runs its course.  We are sorry you are not feeling well.  Here is how we plan to help!   For nasal congestion, you may use an oral decongestants such as Mucinex D or if you have glaucoma or high blood pressure use plain Mucinex.  Saline nasal spray or nasal drops can help and can safely be used as often as  needed for congestion.  For your congestion, I have prescribed Fluticasone nasal spray one spray in each nostril twice a day  If you do not have a history of heart disease, hypertension, diabetes or thyroid disease, prostate/bladder issues or glaucoma, you may also use Sudafed to treat nasal congestion.  It is highly recommended that you consult with a pharmacist or your primary care physician to ensure this medication is safe for you to take.     If you have a cough, you may use cough suppressants such as Delsym and Robitussin.  If you have glaucoma or high blood pressure, you can also use Coricidin HBP.   For cough I have prescribed for you A prescription cough medication called Tessalon Perles 100 mg. You may take 1-2 capsules every 8 hours as needed for cough  If you have a sore or scratchy throat, use a saltwater gargle-  to  teaspoon of salt dissolved in a 4-ounce to 8-ounce glass of warm water.  Gargle the solution for approximately 15-30 seconds and then spit.  It is important not to swallow the solution.  You can also use throat lozenges/cough drops and Chloraseptic spray to help with throat pain or discomfort.  Warm or cold liquids can also be helpful in relieving throat pain.  For headache, pain or general discomfort, you can use Ibuprofen or Tylenol as directed.   Some authorities believe  that zinc sprays or the use of Echinacea may shorten the course of your symptoms.  A work note has been provided for you today. It will be available under "letters" in your MyChart account.  HOME CARE Only take medications as instructed by your medical team. Be sure to drink plenty of fluids. Water is fine as well as fruit juices, sodas and electrolyte beverages. You may want to stay away from caffeine or alcohol. If you are nauseated, try taking small sips of liquids. How do you know if you are getting enough fluid? Your urine should be a pale yellow or almost colorless. Get rest. Taking a steamy  shower or using a humidifier may help nasal congestion and ease sore throat pain. You can place a towel over your head and breathe in the steam from hot water coming from a faucet. Using a saline nasal spray works much the same way. Cough drops, hard candies and sore throat lozenges may ease your cough. Avoid close contacts especially the very young and the elderly Cover your mouth if you cough or sneeze Always remember to wash your hands.   GET HELP RIGHT AWAY IF: You develop worsening fever. If your symptoms do not improve within 10 days You develop yellow or green discharge from your nose over 3 days. You have coughing fits You develop a severe head ache or visual changes. You develop shortness of breath, difficulty breathing or start having chest pain Your symptoms persist after you have completed your treatment plan  MAKE SURE YOU  Understand these instructions. Will watch your condition. Will get help right away if you are not doing well or get worse.  Thank you for choosing an e-visit.  Your e-visit answers were reviewed by a board certified advanced clinical practitioner to complete your personal care plan. Depending upon the condition, your plan could have included both over the counter or prescription medications.  Please review your pharmacy choice. Make sure the pharmacy is open so you can pick up prescription now. If there is a problem, you may contact your provider through [employer] and have the prescription routed to another pharmacy.  Your safety is important to [place]. If you have drug allergies check your prescription carefully.   For the next 24 hours you can use MyChart to ask questions about today's visit, request a non-urgent call back, or ask for a work or school excuse. You will get an email in the next two days asking about your experience. I hope that your e-visit has been valuable and will speed your recovery.   I have spent 5 minutes in review of e-visit  questionnaire, review and updating patient chart, medical decision making and response to patient.   Margaretann Loveless, PA-C

## 2023-10-24 ENCOUNTER — Telehealth: Payer: Self-pay | Admitting: Physician Assistant

## 2023-10-24 DIAGNOSIS — J069 Acute upper respiratory infection, unspecified: Secondary | ICD-10-CM

## 2023-10-24 MED ORDER — PROMETHAZINE-DM 6.25-15 MG/5ML PO SYRP: 5.0000 mL | ORAL_SOLUTION | Freq: Four times a day (QID) | ORAL | 0 refills | Status: AC | PRN

## 2023-10-24 NOTE — Progress Notes (Signed)

## 2023-10-24 NOTE — Progress Notes (Signed)
 I have spent 5 minutes in review of e-visit questionnaire, review and updating patient chart, medical decision making and response to patient.   Piedad Climes, PA-C

## 2023-11-23 ENCOUNTER — Telehealth: Payer: Self-pay | Admitting: Family Medicine

## 2023-11-23 DIAGNOSIS — K649 Unspecified hemorrhoids: Secondary | ICD-10-CM

## 2023-11-23 MED ORDER — HYDROCORTISONE 2.5 % EX CREA: TOPICAL_CREAM | Freq: Two times a day (BID) | CUTANEOUS | 0 refills | Status: AC

## 2023-11-23 NOTE — Progress Notes (Signed)
 E-Visit for Hemorrhoid  We are sorry that you are not feeling well. We are here to help!  Hemorrhoids are swollen veins in the rectum. They can cause itching, bleeding, and pain. Hemorrhoids are very common.  In some cases, you can see or feel hemorrhoids around the outside of the rectum. In other cases, you cannot see them because they are hidden inside the rectum. Be patient - It can take months for this to improve or go away.   Hemorrhoids do not always cause symptoms. But when they do, symptoms can include: ?Itching of the skin around the anus ?Bleeding - Bleeding is usually painless. You might see bright red blood after using the toilet. ?Pain - If a blood clot forms inside a hemorrhoid, this can cause pain. It can also cause a lump that you might be able to feel.   What can I do to keep from getting more hemorrhoids? -- The most important thing you can do is to keep from getting constipated. You should have a bowel movement at least a few times a week. When you have a bowel movement, you also should not have to push too much. Plus, your bowel movements should not be too hard. Being constipated and having hard bowel movements can make hemorrhoids worse.   I have prescribed Topical Hydrocortisone  ointment 2.5%.  Apply to area two times per day for 14 days, as needed.   HOME CARE: Sitz Baths twice daily. Soak buttocks in 2 or 3 inches of warm water for 10 to 15 minutes. Do not add soap, bubble bath, or anything to the water. Stool softener such as Colace 100 mg twice daily AND Miralax  1 scoop daily until you have regular soft stools Over the counter Preparation H Tucks Pads Witch Hazel  Here are some steps you can take to avoid getting constipated or having hard stools:  ?Eat lots of fruits, vegetables, and other foods with fiber. Fiber helps to increase bowel movements. If you do not get enough fiber from your diet, you can take fiber supplements. These come in the form of powders,  wafers, or pills. Some examples are Metamucil, Citrucel, Benefiber and FiberCon. If you take a fiber supplement, be sure to read the label so you know how much to take. If you're not sure, ask your provider or nurse. ?Take medicines called "stool softeners" such as docusate sodium (sample brand names: Colace, Dulcolax). These medicines increase the number of bowel movements you have. They are safe to take and they can prevent problems later.  You should request a referral for a follow up evaluation with a Gastroenterologist (GI doctor) to evaluate this chronic and relapsing condition - even if it improves to see what further steps need to be taken. This is highly linked to chronic constipation and straining to have a bowel movement. It may require further treatment or surgical intervention.   GET HELP RIGHT AWAY IF: You develop severe pain You have heavy bleeding   FOLLOW UP WITH YOUR PRIMARY PROVIDER IF: If your symptoms do not improve within 10 days  MAKE SURE YOU  Understand these instructions. Will watch your condition. Will get help right away if you are not doing well or get worse.   Thank you for choosing an e-visit.  Your e-visit answers were reviewed by a board certified advanced clinical practitioner to complete your personal care plan. Depending upon the condition, your plan could have included both over the counter or prescription medications.  Please review your  pharmacy choice. Make sure the pharmacy is open so you can pick up prescription now. If there is a problem, you may contact your provider through [employer] and have the prescription routed to another pharmacy.  Your safety is important to us . If you have drug allergies check your prescription carefully.   For the next 24 hours you can use MyChart to ask questions about today's visit, request a non-urgent call back, or ask for a work or school excuse. You will get an email in the next two days asking about your  experience. I hope that your e-visit has been valuable and will speed your recovery.  I have spent 5 minutes in review of e-visit questionnaire, review and updating patient chart, medical decision making and response to patient.   Louvenia Roys, PA-C

## 2023-11-28 ENCOUNTER — Telehealth: Payer: Self-pay | Admitting: Physician Assistant

## 2023-11-28 DIAGNOSIS — M109 Gout, unspecified: Secondary | ICD-10-CM

## 2023-11-28 MED ORDER — PREDNISONE 20 MG PO TABS
40.0000 mg | ORAL_TABLET | Freq: Every day | ORAL | 0 refills | Status: AC
Start: 2023-11-28 — End: ?

## 2023-11-28 MED ORDER — COLCHICINE 0.6 MG PO TABS: ORAL_TABLET | ORAL | 0 refills | Status: DC

## 2023-11-28 NOTE — Addendum Note (Signed)
 Addended by: Farris Hong on: 11/28/2023 07:44 PM   Modules accepted: Orders

## 2023-11-28 NOTE — Progress Notes (Signed)
 E-Visit for Gout Symptoms  We are sorry that you are not feeling well. We are here to help!  Based on what you shared with me it looks like you have a flare of your gout.  Gout is a form of arthritis. It can cause pain and swelling in the joints. At first, it tends to affect only 1 joint - most frequently the big toe. It happens in people who have too much uric acid in the blood. Uric acid is a chemical that is produced when the body breaks down certain foods. Uric acid can form sharp needle-like crystals that build up in the joints and cause pain. Uric acid crystals can also form inside the tubes that carry urine from the kidneys to the bladder. These crystals can turn into "kidney stones" that can cause pain and problems with the flow of urine. People with gout get sudden "flares" or attacks of severe pain, most often the big toe, ankle, or knee. Often the joint also turns red and swells. Usually, only 1 joint is affected, but some people have pain in more than 1 joint. Gout flares tend to happen more often during the night.  The pain from gout can be extreme. The pain and swelling are worst at the beginning of a gout flare. The symptoms then get better within a few days to weeks. It is not clear how the body "turns off" a gout flare.  Do not start any NEW preventative medicine until the gout has cleared completely. However, If you are already on Probenecid or Allopurinol for CHRONIC gout, you may continue taking this during an active flare up  I have prescribed Colchicine  0.6 mg tabs - Take 2 tabs immediately, then 1 tab twice per day for the duration of the flare up to a max of 7 days (but discontinue for stomach pains or diarrhea) . This is the only medicine currently on file for you for this issue, but should calm this gout flare down for you. Please follow-up with your regular providers for ongoing management and prevention.    HOME CARE Losing weight can help relieve gout. It's not clear that  following a specific diet plan will help with gout symptoms but eating a balanced diet can help improve your overall health. It can also help you lose weight, if you are overweight. In general, a healthy diet includes plenty of fruits, vegetables, whole grains, and low-fat dairy products (labelled "low fat", skim, 2%). Avoid sugar sweetened drinks (including sodas, tea, juice and juice blends, coffee drinks and sports drinks) Limit alcohol to 1-2 drinks of beer, spirits or wine daily these can make gout flares worse. Some people with gout also have other health problems, such as heart disease, high blood pressure, kidney disease, or obesity. If you have any of these issues, it's important to work with your doctor to manage them. This can help improve your overall health and might also help with your gout.  GET HELP RIGHT AWAY IF: Your symptoms persist after you have completed your treatment plan You develop severe diarrhea You develop abnormal sensations  You develop vomiting,   You develop weakness  You develop abdominal pain  FOLLOW UP WITH YOUR PRIMARY PROVIDER IF: If your symptoms do not improve within 10 days  MAKE SURE YOU  Understand these instructions. Will watch your condition. Will get help right away if you are not doing well or get worse.  Thank you for choosing an e-visit.  Your e-visit answers were  reviewed by a board certified advanced clinical practitioner to complete your personal care plan. Depending upon the condition, your plan could have included both over the counter or prescription medications.  Please review your pharmacy choice. Make sure the pharmacy is open so you can pick up prescription now. If there is a problem, you may contact your provider through [employer] and have the prescription routed to another pharmacy.  Your safety is important to us . If you have drug allergies check your prescription carefully.   For the next 24 hours you can use MyChart to  ask questions about today's visit, request a non-urgent call back, or ask for a work or school excuse. You will get an email in the next two days asking about your experience. I hope that your e-visit has been valuable and will speed your recovery.

## 2023-11-28 NOTE — Progress Notes (Signed)
 I have spent 5 minutes in review of e-visit questionnaire, review and updating patient chart, medical decision making and response to patient.   Piedad Climes, PA-C

## 2023-12-23 ENCOUNTER — Emergency Department (HOSPITAL_BASED_OUTPATIENT_CLINIC_OR_DEPARTMENT_OTHER)
Admission: EM | Admit: 2023-12-23 | Discharge: 2023-12-23 | Discharge status: Against Medical Advice | Payer: Self-pay | Attending: Emergency Medicine | Admitting: Emergency Medicine

## 2023-12-23 LAB — CBG MONITORING, ED: Glucose-Capillary: 150 mg/dL — ABNORMAL HIGH (ref 70–99)

## 2023-12-23 NOTE — ED Notes (Addendum)
 Went to triage patient and he had left. Told the NT that he was only here for his blood sugar check because he does not have a way at home to monitor.

## 2023-12-24 ENCOUNTER — Telehealth: Payer: Self-pay | Admitting: Physician Assistant

## 2023-12-24 DIAGNOSIS — E119 Type 2 diabetes mellitus without complications: Secondary | ICD-10-CM

## 2023-12-24 NOTE — Progress Notes (Signed)
   Thank you for the details you included in the comment boxes. Those details are very helpful in determining the best course of treatment for you and help us  to provide the best care.Because we cannot prescribe/treat regarding diabetes through an e-visit, we recommend that you schedule a Virtual Urgent Care video visit in order for the provider to better assess what is going on.  The provider will be able to give you a more accurate diagnosis and treatment plan if we can more freely discuss your symptoms and with the addition of a virtual examination.   If you change your visit to a video visit, we will bill your insurance (similar to an office visit) and you will not be charged for this e-Visit. You will be able to stay at home and speak with the first available [facility] advanced practice provider. The link to do a video visit is in the drop down Menu tab of your Welcome screen in MyChart.

## 2023-12-30 ENCOUNTER — Telehealth: Payer: Self-pay | Admitting: Physician Assistant

## 2023-12-30 DIAGNOSIS — R21 Rash and other nonspecific skin eruption: Secondary | ICD-10-CM

## 2023-12-30 MED ORDER — TRIAMCINOLONE ACETONIDE 0.5 % EX CREA
1.0000 | TOPICAL_CREAM | Freq: Three times a day (TID) | CUTANEOUS | 0 refills | Status: DC
Start: 2023-12-30 — End: 2023-12-30

## 2023-12-30 MED ORDER — TRIAMCINOLONE ACETONIDE 0.5 % EX OINT: 1.0000 | TOPICAL_OINTMENT | Freq: Two times a day (BID) | CUTANEOUS | 0 refills | Status: DC

## 2023-12-30 NOTE — Progress Notes (Signed)
 E Visit for Rash  We are sorry that you are not feeling well. Here is how we plan to help!  Based on what you shared with me it looks like you have contact dermatitis.  Contact dermatitis is a skin rash caused by something that touches the skin and causes irritation or inflammation.  Your skin may be red, swollen, dry, cracked, and itch.  The rash should go away in a few days but can last a few weeks.  If you get a rash, it's important to figure out what caused it so the irritant can be avoided in the future. and I am prescribing triamcinolone  0.5 % cream -- apply to the affected area(s) in a thin layer, twice daily for up to 14 days. Do not apply to face, privates or armpit regions.    HOME CARE:  Take cool showers and avoid direct sunlight. Apply cool compress or wet dressings. Take a bath in an oatmeal bath.  Sprinkle content of one Aveeno packet under running faucet with comfortably warm water.  Bathe for 15-20 minutes, 1-2 times daily.  Pat dry with a towel. Do not rub the rash. Use hydrocortisone  cream. Take an antihistamine like Benadryl for widespread rashes that itch.  The adult dose of Benadryl is 25-50 mg by mouth 4 times daily. Caution:  This type of medication may cause sleepiness.  Do not drink alcohol, drive, or operate dangerous machinery while taking antihistamines.  Do not take these medications if you have prostate enlargement.  Read package instructions thoroughly on all medications that you take.  GET HELP RIGHT AWAY IF:  Symptoms don't go away after treatment. Severe itching that persists. If you rash spreads or swells. If you rash begins to smell. If it blisters and opens or develops a yellow-brown crust. You develop a fever. You have a sore throat. You become short of breath.  MAKE SURE YOU:  Understand these instructions. Will watch your condition. Will get help right away if you are not doing well or get worse.  Thank you for choosing an e-visit.  Your  e-visit answers were reviewed by a board certified advanced clinical practitioner to complete your personal care plan. Depending upon the condition, your plan could have included both over the counter or prescription medications.  Please review your pharmacy choice. Make sure the pharmacy is open so you can pick up prescription now. If there is a problem, you may contact your provider through [employer] and have the prescription routed to another pharmacy.  Your safety is important to us . If you have drug allergies check your prescription carefully.   For the next 24 hours you can use MyChart to ask questions about today's visit, request a non-urgent call back, or ask for a work or school excuse. You will get an email in the next two days asking about your experience. I hope that your e-visit has been valuable and will speed your recovery.   I have spent 5 minutes in review of e-visit questionnaire, review and updating patient chart, medical decision making and response to patient.   Angelia Kelp, PA-C

## 2023-12-30 NOTE — Addendum Note (Signed)
 Addended by: Angelia Kelp on: 12/30/2023 07:38 PM   Modules accepted: Orders

## 2024-01-26 ENCOUNTER — Telehealth: Payer: Self-pay | Admitting: Family Medicine

## 2024-01-26 DIAGNOSIS — K047 Periapical abscess without sinus: Secondary | ICD-10-CM

## 2024-01-26 MED ORDER — IBUPROFEN 600 MG PO TABS
600.0000 mg | ORAL_TABLET | Freq: Three times a day (TID) | ORAL | 0 refills | Status: DC | PRN
Start: 2024-01-26 — End: 2024-01-27

## 2024-01-26 MED ORDER — PENICILLIN V POTASSIUM 500 MG PO TABS: 500.0000 mg | ORAL_TABLET | Freq: Three times a day (TID) | ORAL | 0 refills | Status: AC

## 2024-01-26 NOTE — Progress Notes (Signed)

## 2024-01-26 NOTE — Addendum Note (Signed)
 Addended by: ALMEDA DEGREE on: 01/26/2024 01:22 PM   Modules accepted: Orders

## 2024-01-27 ENCOUNTER — Ambulatory Visit
Admission: RE | Admit: 2024-01-27 | Discharge: 2024-01-27 | Disposition: A | Discharge status: Home / Self Care | Payer: Self-pay | Source: Ambulatory Visit | Attending: Family Medicine | Admitting: Family Medicine

## 2024-01-27 VITALS — BP 160/87 | HR 89 | Temp 97.7°F | Resp 20

## 2024-01-27 DIAGNOSIS — R350 Frequency of micturition: Secondary | ICD-10-CM

## 2024-01-27 DIAGNOSIS — R1011 Right upper quadrant pain: Secondary | ICD-10-CM

## 2024-01-27 LAB — POCT URINALYSIS DIP (MANUAL ENTRY)
Bilirubin, UA: NEGATIVE
Blood, UA: NEGATIVE
Glucose, UA: NEGATIVE mg/dL
Leukocytes, UA: NEGATIVE
Nitrite, UA: NEGATIVE
Protein Ur, POC: 100 mg/dL — AB
Spec Grav, UA: 1.025 (ref 1.010–1.025)
Urobilinogen, UA: 0.2 U/dL
pH, UA: 5.5 (ref 5.0–8.0)

## 2024-01-27 MED ORDER — IBUPROFEN 800 MG PO TABS: 800.0000 mg | ORAL_TABLET | Freq: Three times a day (TID) | ORAL | 0 refills | Status: AC

## 2024-01-27 MED ORDER — METHOCARBAMOL 500 MG PO TABS: 500.0000 mg | ORAL_TABLET | Freq: Two times a day (BID) | ORAL | 0 refills | Status: AC

## 2024-01-27 MED ORDER — LOSARTAN POTASSIUM 50 MG PO TABS
50.0000 mg | ORAL_TABLET | Freq: Every day | ORAL | 0 refills | Status: DC
Start: 2024-01-27 — End: 2024-06-03

## 2024-01-27 NOTE — ED Provider Notes (Addendum)
 [facility]  Note:  This document was prepared using Conservation officer, historic buildings and may include unintentional dictation errors.  MRN: [id number] DOB: 03/31/1976  Subjective:   Benjamin Meyer is a 48 y.o. male presenting for 2-day history of persistent right-sided abdominal pain, right flank pain, slight urinary frequency.  Symptoms started when he was rising from a laying position.  No fever, hematuria, dysuria, weakened stream, constipation, diarrhea, chest pain, shortness of breath.  No history of diverticulitis.  No bloody stools.  Last alcohol drink was 2 weeks ago.  No current facility-administered medications for this encounter.  Current Outpatient Medications:    baclofen  (LIORESAL ) 10 MG tablet, Take 1 tablet (10 mg total) by mouth 3 (three) times daily., Disp: 30 each, Rfl: 0   benzonatate  (TESSALON ) 100 MG capsule, Take 1-2 capsules (100-200 mg total) by mouth 3 (three) times daily as needed., Disp: 30 capsule, Rfl: 0   colchicine  0.6 MG tablet, Take 2 tabs immediately, then 1 tab twice per day for the duration of the flare up to a max of 7 days, Disp: 21 tablet, Rfl: 0   fluticasone  (FLONASE ) 50 MCG/ACT nasal spray, Place 2 sprays into both nostrils daily., Disp: 16 g, Rfl: 0   ibuprofen  (ADVIL ) 600 MG tablet, Take 1 tablet (600 mg total) by mouth every 8 (eight) hours as needed., Disp: 30 tablet, Rfl: 0   losartan  (COZAAR ) 50 MG tablet, Take 1 tablet (50 mg total) by mouth daily., Disp: 90 tablet, Rfl: 0   metFORMIN  (GLUCOPHAGE ) 500 MG tablet, Take 1 tablet (500 mg total) by mouth 2 (two) times daily with a meal., Disp: 60 tablet, Rfl: 0   naproxen  (NAPROSYN ) 500 MG tablet, Take 1 tablet (500 mg total) by mouth 2 (two) times daily with a meal., Disp: 30 tablet, Rfl: 0   nystatin  cream (MYCOSTATIN ), Apply 1 Application topically 2 (two) times daily., Disp: 90 g, Rfl: 0   penicillin  v potassium (VEETID) 500 MG tablet, Take 1 tablet (500 mg total) by  mouth 3 (three) times daily for 10 days., Disp: 30 tablet, Rfl: 0   predniSONE  (DELTASONE ) 20 MG tablet, Take 2 tablets (40 mg total) by mouth daily with breakfast., Disp: 10 tablet, Rfl: 0   promethazine -dextromethorphan (PROMETHAZINE -DM) 6.25-15 MG/5ML syrup, Take 5 mLs by mouth 4 (four) times daily as needed for cough., Disp: 118 mL, Rfl: 0   triamcinolone  ointment (KENALOG ) 0.5 %, Apply 1 Application topically 2 (two) times daily., Disp: 30 g, Rfl: 0   Allergies  Allergen Reactions   Lisinopril  Cough    Past Medical History:  Diagnosis Date   Gout    Hypertension      History reviewed. No pertinent surgical history.  Family History  Problem Relation Age of Onset   Hypertension Mother    Diabetes Mother    Heart failure Father    Hypertension Father     Social History   Tobacco Use   Smoking status: Never   Smokeless tobacco: Never  Vaping Use   Vaping status: Never Used  Substance Use Topics   Alcohol use: Yes    Comment: occasionally   Drug use: No    ROS   Objective:   Vitals: BP (!) 160/87 (BP Location: Left Arm)   Pulse 89   Temp 97.7 F (36.5 C) (Oral)   Resp 20   SpO2 92%   Physical Exam Constitutional:      General: He is not in acute distress.  Appearance: Normal appearance. He is well-developed and normal weight. He is not ill-appearing, toxic-appearing or diaphoretic.  HENT:     Head: Normocephalic and atraumatic.     Right Ear: External ear normal.     Left Ear: External ear normal.     Nose: Nose normal.     Mouth/Throat:     Mouth: Mucous membranes are moist.     Pharynx: Oropharynx is clear.  Eyes:     General: No scleral icterus.       Right eye: No discharge.        Left eye: No discharge.     Extraocular Movements: Extraocular movements intact.     Conjunctiva/sclera: Conjunctivae normal.  Cardiovascular:     Rate and Rhythm: Normal rate and regular rhythm.     Heart sounds: No murmur heard.    No friction rub. No gallop.   Pulmonary:     Effort: Pulmonary effort is normal. No respiratory distress.     Breath sounds: No wheezing or rales.  Abdominal:     General: Bowel sounds are normal. There is no distension.     Palpations: Abdomen is soft. There is no mass.     Tenderness: There is abdominal tenderness in the right upper quadrant. There is no right CVA tenderness, left CVA tenderness, guarding or rebound. Positive signs include Murphy's sign.  Musculoskeletal:     Cervical back: Normal range of motion.  Skin:    General: Skin is warm and dry.  Neurological:     Mental Status: He is alert and oriented to person, place, and time.  Psychiatric:        Mood and Affect: Mood normal.        Behavior: Behavior normal.        Thought Content: Thought content normal.        Judgment: Judgment normal.     Results for orders placed or performed during the hospital encounter of 01/27/24 (from the past 24 hours)  POCT urinalysis dipstick     Status: Abnormal   Collection Time: 01/27/24  7:31 PM  Result Value Ref Range   Color, UA other (A) yellow   Clarity, UA clear clear   Glucose, UA negative negative mg/dL   Bilirubin, UA negative negative   Ketones, POC UA trace (5) (A) negative mg/dL   Spec Grav, UA 8.974 8.989 - 1.025   Blood, UA negative negative   pH, UA 5.5 5.0 - 8.0   Protein Ur, POC =100 (A) negative mg/dL   Urobilinogen, UA 0.2 0.2 or 1.0 E.U./dL   Nitrite, UA Negative Negative   Leukocytes, UA Negative Negative    Assessment and Plan :   PDMP not reviewed this encounter.  1. RUQ pain   2. Urinary frequency    Urine culture pending.  Patient has concerns about straining a muscle or kidney infection.  I have no concern for pyelonephritis.  My primary suspicion is gallbladder disease.  Patient is not inclined to go to the emergency room now.  Did offer ibuprofen  and a muscle relaxant to address this concern about a muscle strain but emphasized that he needs a workup to rule out  gallbladder disease versus an acute abdomen.  This can best be done through imaging in the emergency setting.  Patient verbalized understanding.   Christopher Savannah, PA-C 01/27/24 1945  At discharge patient requested a medication refill encounter. Wants his blood pressure medications refilled. I am agreeable to do a 15 day refill  since he has already gotten courtesy refills through urgent care, needs follow up with his PCP asap.    Christopher Savannah, [place] 01/27/24 1951

## 2024-01-27 NOTE — ED Triage Notes (Addendum)
 Pt c/o right side abd pain, increase urination x 2 days-pain worse with a sneeze yesterday-taking advil  with some relief-state sshe feels he has a kidney infection-denies rib injury/stated c/o on reg-NAD-steady gait

## 2024-01-28 LAB — URINE CULTURE: Culture: <10000 — AB

## 2024-01-29 ENCOUNTER — Ambulatory Visit (HOSPITAL_COMMUNITY): Payer: Self-pay

## 2024-02-21 ENCOUNTER — Telehealth: Payer: Self-pay | Admitting: Family Medicine

## 2024-02-21 DIAGNOSIS — M545 Low back pain, unspecified: Secondary | ICD-10-CM

## 2024-02-21 MED ORDER — NAPROXEN 500 MG PO TABS: 500.0000 mg | ORAL_TABLET | Freq: Two times a day (BID) | ORAL | 0 refills | Status: AC

## 2024-02-21 MED ORDER — METHOCARBAMOL 500 MG PO TABS: 500.0000 mg | ORAL_TABLET | Freq: Four times a day (QID) | ORAL | 0 refills | Status: AC | PRN

## 2024-02-21 NOTE — Progress Notes (Signed)
 E-Visit for Back Pain   We are sorry that you are not feeling well.  Here is how we plan to help!  Based on what you have shared with me it looks like you mostly have acute back pain.  Acute back pain is defined as musculoskeletal pain that can resolve in 1-3 weeks with conservative treatment.  I have prescribed Naprosyn  500 mg take one by mouth twice a day non-steroid anti-inflammatory (NSAID) as well as robaxin .  Some patients experience stomach irritation or in increased heartburn with anti-inflammatory drugs.  Please keep in mind that muscle relaxer's can cause fatigue and should not be taken while at work or driving.  Back pain is very common.  The pain often gets better over time.  The cause of back pain is usually not dangerous.  Most people can learn to manage their back pain on their own.  Home Care Stay active.  Start with short walks on flat ground if you can.  Try to walk farther each day. Do not sit, drive or stand in one place for more than 30 minutes.  Do not stay in bed. Do not avoid exercise or work.  Activity can help your back heal faster. Be careful when you bend or lift an object.  Bend at your knees, keep the object close to you, and do not twist. Sleep on a firm mattress.  Lie on your side, and bend your knees.  If you lie on your back, put a pillow under your knees. Only take medicines as told by your doctor. Put ice on the injured area. Put ice in a plastic bag Place a towel between your skin and the bag Leave the ice on for 15-20 minutes, 3-4 times a day for the first 2-3 days. 210 After that, you can switch between ice and heat packs. Ask your doctor about back exercises or massage. Avoid feeling anxious or stressed.  Find good ways to deal with stress, such as exercise.  Get Help Right Way If: Your pain does not go away with rest or medicine. Your pain does not go away in 1 week. You have new problems. You do not feel well. The pain spreads into your  legs. You cannot control when you poop (bowel movement) or pee (urinate) You feel sick to your stomach (nauseous) or throw up (vomit) You have belly (abdominal) pain. You feel like you may pass out (faint). If you develop a fever.  Make Sure you: Understand these instructions. Will watch your condition Will get help right away if you are not doing well or get worse.  Your e-visit answers were reviewed by a board certified advanced clinical practitioner to complete your personal care plan.  Depending on the condition, your plan could have included both over the counter or prescription medications.  If there is a problem please reply  once you have received a response from your provider.  Your safety is important to us .  If you have drug allergies check your prescription carefully.    You can use MyChart to ask questions about today's visit, request a non-urgent call back, or ask for a work or school excuse for 24 hours related to this e-Visit. If it has been greater than 24 hours you will need to follow up with your provider, or enter a new e-Visit to address those concerns.  You will get an e-mail in the next two days asking about your experience.  I hope that your e-visit has been valuable and  will speed your recovery. Thank you for using e-visits.    have provided 5 minutes of non face to face time during this encounter for chart review and documentation.

## 2024-02-26 ENCOUNTER — Telehealth: Payer: Self-pay | Admitting: Nurse Practitioner

## 2024-02-26 DIAGNOSIS — K047 Periapical abscess without sinus: Secondary | ICD-10-CM

## 2024-02-26 MED ORDER — CLINDAMYCIN HCL 300 MG PO CAPS: 300.0000 mg | ORAL_CAPSULE | Freq: Three times a day (TID) | ORAL | 0 refills | Status: AC

## 2024-02-26 NOTE — Progress Notes (Signed)
 E-Visit for Dental Pain  We are sorry that you are not feeling well.  Here is how we plan to help!  Based on what you have shared with me in the questionnaire, it sounds like you have an infection related to tooth eruption   Clindamycin  300mg  3 times a day for 7 days  It is imperative that you see a dentist within 10 days of this eVisit to determine the cause of the dental pain and be sure it is adequately treated  A toothache or tooth pain is caused when the nerve in the root of a tooth or surrounding a tooth is irritated. Dental (tooth) infection, decay, injury, or loss of a tooth are the most common causes of dental pain. Pain may also occur after an extraction (tooth is pulled out). Pain sometimes originates from other areas and radiates to the jaw, thus appearing to be tooth pain.Bacteria growing inside your mouth can contribute to gum disease and dental decay, both of which can cause pain. A toothache occurs from inflammation of the central portion of the tooth called pulp. The pulp contains nerve endings that are very sensitive to pain. Inflammation to the pulp or pulpitis may be caused by dental cavities, trauma, and infection.    HOME CARE:   For toothaches: Over-the-counter pain medications such as acetaminophen or ibuprofen  may be used. Take these as directed on the package while you arrange for a dental appointment. Avoid very cold or hot foods, because they may make the pain worse. You may get relief from biting on a cotton ball soaked in oil of cloves. You can get oil of cloves at most drug stores.  For jaw pain:  Aspirin may be helpful for problems in the joint of the jaw in adults. If pain happens every time you open your mouth widely, the temporomandibular joint (TMJ) may be the source of the pain. Yawning or taking a large bite of food may worsen the pain. An appointment with your doctor or dentist will help you find the cause.     GET HELP RIGHT AWAY IF:  You have a  high fever or chills If you have had a recent head or face injury and develop headache, light headedness, nausea, vomiting, or other symptoms that concern you after an injury to your face or mouth, you could have a more serious injury in addition to your dental injury. A facial rash associated with a toothache: This condition may improve with medication. Contact your doctor for them to decide what is appropriate. Any jaw pain occurring with chest pain: Although jaw pain is most commonly caused by dental disease, it is sometimes referred pain from other areas. People with heart disease, especially people who have had stents placed, people with diabetes, or those who have had heart surgery may have jaw pain as a symptom of heart attack or angina. If your jaw or tooth pain is associated with lightheadedness, sweating, or shortness of breath, you should see a doctor as soon as possible. Trouble swallowing or excessive pain or bleeding from gums: If you have a history of a weakened immune system, diabetes, or steroid use, you may be more susceptible to infections. Infections can often be more severe and extensive or caused by unusual organisms. Dental and gum infections in people with these conditions may require more aggressive treatment. An abscess may need draining or IV antibiotics, for example.  MAKE SURE YOU   Understand these instructions. Will watch your condition. Will get help  right away if you are not doing well or get worse.  Thank you for choosing an e-visit.  Your e-visit answers were reviewed by a board certified advanced clinical practitioner to complete your personal care plan. Depending upon the condition, your plan could have included both over the counter or prescription medications.  Please review your pharmacy choice. Make sure the pharmacy is open so you can pick up prescription now. If there is a problem, you may contact your provider through [employer] and have the  prescription routed to another pharmacy.  Your safety is important to us . If you have drug allergies check your prescription carefully.   For the next 24 hours you can use MyChart to ask questions about today's visit, request a non-urgent call back, or ask for a work or school excuse. You will get an email in the next two days asking about your experience. I hope that your e-visit has been valuable and will speed your recovery.  I spent approximately 5 minutes reviewing the patient's history, current symptoms and coordinating their care today.

## 2024-03-18 ENCOUNTER — Telehealth: Payer: Self-pay | Admitting: Physician Assistant

## 2024-03-18 DIAGNOSIS — B999 Unspecified infectious disease: Secondary | ICD-10-CM

## 2024-03-18 NOTE — Progress Notes (Signed)
  Because of multiple treatments for dental pain/infection over past couple of months, most recently 2 weeks ago, via e-visit, I feel your condition warrants further evaluation and I recommend that you be seen in a face-to-face visit.   NOTE: There will be NO CHARGE for this E-Visit   If you are having a true medical emergency, please call 911.     For an urgent face to face visit, [facility] has multiple urgent care centers for your convenience.  Click the link below for the full list of locations and hours, walk-in wait times, appointment scheduling options and driving directions:  Urgent Care - [place], [place], [place], [place], [place], [place]  [place]     Your MyChart E-visit questionnaire answers were reviewed by a board certified advanced clinical practitioner to complete your personal care plan based on your specific symptoms.    Thank you for using e-Visits.

## 2024-05-05 ENCOUNTER — Telehealth: Payer: Self-pay | Admitting: Physician Assistant

## 2024-05-05 DIAGNOSIS — T7840XA Allergy, unspecified, initial encounter: Secondary | ICD-10-CM

## 2024-05-05 MED ORDER — TRIAMCINOLONE ACETONIDE 0.1 % EX OINT: 1.0000 | TOPICAL_OINTMENT | Freq: Two times a day (BID) | CUTANEOUS | 0 refills | Status: AC

## 2024-05-05 NOTE — Progress Notes (Signed)
 E Visit for Rash  We are sorry that you are not feeling well. Here is how we plan to help!  Based on what you shared with me it looks like you have contact dermatitis.  Contact dermatitis is a skin rash caused by something that touches the skin and causes irritation or inflammation.  Your skin may be red, swollen, dry, cracked, and itch.  The rash should go away in a few days but can last a few weeks.  If you get a rash, it's important to figure out what caused it so the irritant can be avoided in the future. and I am prescribing triamcinolone  0.1 % ointment -- apply to the affected area(s) in a thin layer, twice daily for up to 14 days. Do not apply to face, privates or armpit regions.   Unfortunately we cannot provide refills of chronic medications through e-visit. As such you will need to reach out to your PCP for additional refills, or you can schedule a Virtual Urgent Care video visit through your MyChart for a one-time refill if you cannot get in with your PCP.    HOME CARE:  Take cool showers and avoid direct sunlight. Apply cool compress or wet dressings. Take a bath in an oatmeal bath.  Sprinkle content of one Aveeno packet under running faucet with comfortably warm water.  Bathe for 15-20 minutes, 1-2 times daily.  Pat dry with a towel. Do not rub the rash. Use hydrocortisone  cream. Take an antihistamine like Benadryl for widespread rashes that itch.  The adult dose of Benadryl is 25-50 mg by mouth 4 times daily. Caution:  This type of medication may cause sleepiness.  Do not drink alcohol, drive, or operate dangerous machinery while taking antihistamines.  Do not take these medications if you have prostate enlargement.  Read package instructions thoroughly on all medications that you take.  GET HELP RIGHT AWAY IF:  Symptoms don't go away after treatment. Severe itching that persists. If you rash spreads or swells. If you rash begins to smell. If it blisters and opens or develops a  yellow-brown crust. You develop a fever. You have a sore throat. You become short of breath.  MAKE SURE YOU:  Understand these instructions. Will watch your condition. Will get help right away if you are not doing well or get worse.  Thank you for choosing an e-visit. Your e-visit answers were reviewed by a board certified advanced clinical practitioner to complete your personal care plan. Depending upon the condition, your plan could have included both over the counter or prescription medications. Please review your pharmacy choice. Be sure that the pharmacy you have chosen is open so that you can pick up your prescription now.  If there is a problem you may message your provider in MyChart to have the prescription routed to another pharmacy. Your safety is important to us . If you have drug allergies check your prescription carefully.  For the next 24 hours, you can use MyChart to ask questions about today's visit, request a non-urgent call back, or ask for a work or school excuse from your e-visit provider. You will get an email in the next two days asking about your experience. I hope that your e-visit has been valuable and will speed your recovery.  I have spent 5 minutes in review of e-visit questionnaire, review and updating patient chart, medical decision making and response to patient.   Elsie Velma Lunger, PA-C

## 2024-05-27 ENCOUNTER — Telehealth: Payer: Self-pay | Admitting: Physician Assistant

## 2024-05-27 DIAGNOSIS — M1030 Gout due to renal impairment, unspecified site: Secondary | ICD-10-CM

## 2024-05-27 MED ORDER — COLCHICINE 0.6 MG PO TABS: ORAL_TABLET | ORAL | 0 refills | Status: AC

## 2024-05-27 NOTE — Progress Notes (Signed)
 E-Visit for Gout Symptoms  We are sorry that you are not feeling well. We are here to help!  Based on what you shared with me it looks like you have a flare of your gout.  Gout is a form of arthritis. It can cause pain and swelling in the joints. At first, it tends to affect only 1 joint - most frequently the big toe. It happens in people who have too much uric acid in the blood. Uric acid is a chemical that is produced when the body breaks down certain foods. Uric acid can form sharp needle-like crystals that build up in the joints and cause pain. Uric acid crystals can also form inside the tubes that carry urine from the kidneys to the bladder. These crystals can turn into kidney stones that can cause pain and problems with the flow of urine. People with gout get sudden flares or attacks of severe pain, most often the big toe, ankle, or knee. Often the joint also turns red and swells. Usually, only 1 joint is affected, but some people have pain in more than 1 joint. Gout flares tend to happen more often during the night.  The pain from gout can be extreme. The pain and swelling are worst at the beginning of a gout flare. The symptoms then get better within a few days to weeks. It is not clear how the body turns off a gout flare.  Do not start any NEW preventative medicine until the gout has cleared completely. However, If you are already on Probenecid or Allopurinol for CHRONIC gout, you may continue taking this during an active flare up  I have prescribed Colchicine  0.6 mg tabs - Take 2 tabs immediately, then 1 tab twice per day for the duration of the flare up to a max of 7 days (but discontinue for stomach pains or diarrhea) . I am sending Colchicine  as your last labs on file had shown a slow, but steady, decline in your renal (Kidney) function over the last 4 years. Indomethacin  can harm the kidneys more, so I would be hesitant to add that without more up to date labs. I also would not  recommend Prednisone  since your last A1c on file was 7.5. Both of these labs are from December 2024, so they may be improved. However, I cannot determine that at this time, thus I am choosing the option I feel is safest for you at this time. I apologize if this change from your request does not find you well, but tried to explain why I have went against the personal request to the best of my ability.    HOME CARE Losing weight can help relieve gout. It's not clear that following a specific diet plan will help with gout symptoms but eating a balanced diet can help improve your overall health. It can also help you lose weight, if you are overweight. In general, a healthy diet includes plenty of fruits, vegetables, whole grains, and low-fat dairy products (labelled "low fat", skim, 2%). Avoid sugar sweetened drinks (including sodas, tea, juice and juice blends, coffee drinks and sports drinks) Limit alcohol to 1-2 drinks of beer, spirits or wine daily these can make gout flares worse. Some people with gout also have other health problems, such as heart disease, high blood pressure, kidney disease, or obesity. If you have any of these issues, it's important to work with your doctor to manage them. This can help improve your overall health and might also help with  your gout.  GET HELP RIGHT AWAY IF: Your symptoms persist after you have completed your treatment plan You develop severe diarrhea You develop abnormal sensations  You develop vomiting,   You develop weakness  You develop abdominal pain  FOLLOW UP WITH YOUR PRIMARY PROVIDER IF: If your symptoms do not improve within 10 days  MAKE SURE YOU  Understand these instructions. Will watch your condition. Will get help right away if you are not doing well or get worse.  Thank you for choosing an e-visit.  Your e-visit answers were reviewed by a board certified advanced clinical practitioner to complete your personal care plan. Depending upon  the condition, your plan could have included both over the counter or prescription medications.  Please review your pharmacy choice. Make sure the pharmacy is open so you can pick up prescription now. If there is a problem, you may contact your provider through [employer] and have the prescription routed to another pharmacy.  Your safety is important to us . If you have drug allergies check your prescription carefully.   For the next 24 hours you can use MyChart to ask questions about today's visit, request a non-urgent call back, or ask for a work or school excuse. You will get an email in the next two days asking about your experience. I hope that your e-visit has been valuable and will speed your recovery.  I have spent 5 minutes in review of e-visit questionnaire, review and updating patient chart, medical decision making and response to patient.   Delon CHRISTELLA Dickinson, PA-C

## 2024-06-02 ENCOUNTER — Telehealth: Payer: Self-pay | Admitting: Emergency Medicine

## 2024-06-02 DIAGNOSIS — M109 Gout, unspecified: Secondary | ICD-10-CM

## 2024-06-02 NOTE — Progress Notes (Signed)
  Because I am very limited in what I can manage by evisit, I feel your condition warrants further evaluation and I recommend that you be seen in a face-to-face visit. We are not able to prescribe indomethacin  for you. I am very sorry I cannot help you by evisit.    NOTE: There will be NO CHARGE for this E-Visit   If you are having a true medical emergency, please call 911.     For an urgent face to face visit, [facility] has multiple urgent care centers for your convenience.  Click the link below for the full list of locations and hours, walk-in wait times, appointment scheduling options and driving directions:  Urgent Care - [place], [place], [place], [place], [place], [place]  [place]     Your MyChart E-visit questionnaire answers were reviewed by a board certified advanced clinical practitioner to complete your personal care plan based on your specific symptoms.    Thank you for using e-Visits.

## 2024-06-03 ENCOUNTER — Telehealth: Payer: Self-pay | Admitting: Physician Assistant

## 2024-06-03 DIAGNOSIS — I152 Hypertension secondary to endocrine disorders: Secondary | ICD-10-CM

## 2024-06-03 DIAGNOSIS — E0822 Diabetes mellitus due to underlying condition with diabetic chronic kidney disease: Secondary | ICD-10-CM

## 2024-06-03 DIAGNOSIS — E119 Type 2 diabetes mellitus without complications: Secondary | ICD-10-CM

## 2024-06-03 MED ORDER — LOSARTAN POTASSIUM 50 MG PO TABS: 50.0000 mg | ORAL_TABLET | Freq: Every day | ORAL | 0 refills | Status: AC

## 2024-06-03 MED ORDER — BLOOD GLUCOSE MONITORING SUPPL DEVI: 1.0000 | 0 refills | Status: AC

## 2024-06-03 MED ORDER — BLOOD PRESSURE MONITORING KIT: PACK | 0 refills | Status: AC

## 2024-06-03 MED ORDER — LANCETS MISC: 1.0000 | 0 refills | Status: AC

## 2024-06-03 MED ORDER — BLOOD GLUCOSE TEST VI STRP: 1.0000 | ORAL_STRIP | 0 refills | Status: AC

## 2024-06-03 MED ORDER — LANCET DEVICE MISC: 1.0000 | 0 refills | Status: AC

## 2024-06-03 NOTE — Progress Notes (Signed)
 Virtual Visit Consent   Benjamin Meyer, you are scheduled for a virtual visit with a [facility] provider today. Just as with appointments in the office, your consent must be obtained to participate. Your consent will be active for this visit and any virtual visit you may have with one of our providers in the next 365 days. If you have a MyChart account, a copy of this consent can be sent to you electronically.  As this is a virtual visit, video technology does not allow for your provider to perform a traditional examination. This may limit your provider's ability to fully assess your condition. If your provider identifies any concerns that need to be evaluated in person or the need to arrange testing (such as labs, EKG, etc.), we will make arrangements to do so. Although advances in technology are sophisticated, we cannot ensure that it will always work on either your end or our end. If the connection with a video visit is poor, the visit may have to be switched to a telephone visit. With either a video or telephone visit, we are not always able to ensure that we have a secure connection.  By engaging in this virtual visit, you consent to the provision of healthcare and authorize for your insurance to be billed (if applicable) for the services provided during this visit. Depending on your insurance coverage, you may receive a charge related to this service.  I need to obtain your verbal consent now. Are you willing to proceed with your visit today? Benjamin Meyer has provided verbal consent on 06/03/2024 for a virtual visit (video or telephone). Benjamin Meyer, [place]  Date: 06/03/2024 1:55 PM   Virtual Visit via Video Note   I, Benjamin Meyer, connected with  Benjamin Meyer  ([id number], 1976/05/15) on 06/03/24 at  1:30 PM EST by a video-enabled telemedicine application and verified that I am speaking with the correct person using two identifiers.  Location: Patient: Virtual Visit  Location Patient: Home Provider: Virtual Visit Location Provider: Home Office   I discussed the limitations of evaluation and management by telemedicine and the availability of in person appointments. The patient expressed understanding and agreed to proceed.    History of Present Illness: Benjamin Meyer is a 48 y.o. who identifies as a male who was assigned male at birth, and is being seen today for medication refill requests.  Without a PCP --awaiting an appointment -- 06/2024. Losartan  -- rationing -- out of medication for about 2 weeks. Patient denies chest pain, palpitations, lightheadedness, dizziness, vision changes or frequent headaches.  BP Readings from Last 3 Encounters:  01/27/24 (!) 160/87  07/16/23 (!) 151/89  05/10/22 (!) 143/74   BP cuff is broken at home.     HPI: HPI  Problems: There are no active problems to display for this patient.   Allergies:  Allergies  Allergen Reactions   Lisinopril  Cough   Penicillin  V Potassium    Medications:  Current Outpatient Medications:    Blood Glucose Monitoring Suppl DEVI, 1 each by Does not apply route as directed. Dispense based on patient and insurance preference. Use up to four times daily as directed. (FOR ICD-10 E10.9, E11.9)., Disp: 1 each, Rfl: 0   Blood Pressure Monitoring KIT, Use as directed to check BP daily., Disp: 1 kit, Rfl: 0   Glucose Blood (BLOOD GLUCOSE TEST STRIPS) STRP, 1 each by Does not apply route as directed. Dispense based on patient and insurance preference. Use up to four times daily  as directed. (FOR ICD-10 E10.9, E11.9)., Disp: 100 strip, Rfl: 0   Lancet Device MISC, 1 each by Does not apply route as directed. Dispense based on patient and insurance preference. Use up to four times daily as directed. (FOR ICD-10 E10.9, E11.9)., Disp: 1 each, Rfl: 0   Lancets MISC, 1 each by Does not apply route as directed. Dispense based on patient and insurance preference. Use up to four times daily as directed.  (FOR ICD-10 E10.9, E11.9)., Disp: 100 each, Rfl: 0   baclofen  (LIORESAL ) 10 MG tablet, Take 1 tablet (10 mg total) by mouth 3 (three) times daily., Disp: 30 each, Rfl: 0   benzonatate  (TESSALON ) 100 MG capsule, Take 1-2 capsules (100-200 mg total) by mouth 3 (three) times daily as needed., Disp: 30 capsule, Rfl: 0   colchicine  0.6 MG tablet, Take 2 tabs immediately, then 1 tab twice per day for the duration of the flare up to a max of 7 days (but discontinue for stomach pains or diarrhea), Disp: 30 tablet, Rfl: 0   fluticasone  (FLONASE ) 50 MCG/ACT nasal spray, Place 2 sprays into both nostrils daily., Disp: 16 g, Rfl: 0   ibuprofen  (ADVIL ) 800 MG tablet, Take 1 tablet (800 mg total) by mouth 3 (three) times daily., Disp: 30 tablet, Rfl: 0   losartan  (COZAAR ) 50 MG tablet, Take 1 tablet (50 mg total) by mouth daily., Disp: 15 tablet, Rfl: 0   metFORMIN  (GLUCOPHAGE ) 500 MG tablet, Take 1 tablet (500 mg total) by mouth 2 (two) times daily with a meal., Disp: 60 tablet, Rfl: 0   methocarbamol  (ROBAXIN ) 500 MG tablet, Take 1 tablet (500 mg total) by mouth 2 (two) times daily., Disp: 20 tablet, Rfl: 0   naproxen  (NAPROSYN ) 500 MG tablet, Take 1 tablet (500 mg total) by mouth 2 (two) times daily with a meal., Disp: 30 tablet, Rfl: 0   naproxen  (NAPROSYN ) 500 MG tablet, Take 1 tablet (500 mg total) by mouth 2 (two) times daily with a meal., Disp: 30 tablet, Rfl: 0   nystatin  cream (MYCOSTATIN ), Apply 1 Application topically 2 (two) times daily., Disp: 90 g, Rfl: 0   predniSONE  (DELTASONE ) 20 MG tablet, Take 2 tablets (40 mg total) by mouth daily with breakfast., Disp: 10 tablet, Rfl: 0   promethazine -dextromethorphan (PROMETHAZINE -DM) 6.25-15 MG/5ML syrup, Take 5 mLs by mouth 4 (four) times daily as needed for cough., Disp: 118 mL, Rfl: 0   triamcinolone  ointment (KENALOG ) 0.1 %, Apply 1 Application topically 2 (two) times daily., Disp: 30 g, Rfl: 0  Observations/Objective: Patient is well-developed,  well-nourished in no acute distress.  Resting comfortably at home.  Head is normocephalic, atraumatic.  No labored breathing.  Speech is clear and coherent with logical content.  Patient is alert and oriented at baseline.   Assessment and Plan: 1. Diabetes mellitus without complication (HCC) (Primary)  2. Hypertension associated with diabetes (HCC) - losartan  (COZAAR ) 50 MG tablet; Take 1 tablet (50 mg total) by mouth daily.  Dispense: 15 tablet; Refill: 0 - Blood Pressure Monitoring KIT; Use as directed to check BP daily.  Dispense: 1 kit; Refill: 0  3. 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  Dispense: 1 kit; Refill: 0 - Blood Glucose Monitoring Suppl DEVI; 1 each by Does not apply route as directed. Dispense based on patient and insurance preference. Use up to four  times daily as directed. (FOR ICD-10 E10.9, E11.9).  Dispense: 1 each; Refill: 0 - Glucose Blood (BLOOD GLUCOSE TEST STRIPS) STRP; 1 each by Does not apply route as directed. Dispense based on patient and insurance preference. Use up to four times daily as directed. (FOR ICD-10 E10.9, E11.9).  Dispense: 100 strip; Refill: 0 - Lancet Device MISC; 1 each by Does not apply route as directed. Dispense based on patient and insurance preference. Use up to four times daily as directed. (FOR ICD-10 E10.9, E11.9).  Dispense: 1 each; Refill: 0 - Lancets MISC; 1 each by Does not apply route as directed. Dispense based on patient and insurance preference. Use up to four times daily as directed. (FOR ICD-10 E10.9, E11.9).  Dispense: 100 each; Refill: 0  One-time refill of Losartan  given until he can establish with his new PCP next week. Will go ahead and provide Rx for BP monitoring kit so he can keep track of pressures at home and take to follow-up with new PCP. Also sent  in glucometer kit and supplies giving history of diabetes without current management so he can check fasting levels and take to his PCP follow-up.    Follow Up Instructions: I discussed the assessment and treatment plan with the patient. The patient was provided an opportunity to ask questions and all were answered. The patient agreed with the plan and demonstrated an understanding of the instructions.  A copy of instructions were sent to the patient via MyChart unless otherwise noted below.   The patient was advised to call back or seek an in-person evaluation if the symptoms worsen or if the condition fails to improve as anticipated.    Benjamin Velma Lunger, PA-C

## 2024-06-03 NOTE — Patient Instructions (Signed)
 Benjamin Meyer, thank you for joining Benjamin Velma Lunger, PA-C for today's virtual visit.  While this provider is not your primary care provider (PCP), if your PCP is located in our provider database this encounter information will be shared with them immediately following your visit.   A [facility] MyChart account gives you access to today's visit and all your visits, tests, and labs performed at [facility]  click here if you don't have a [facility] MyChart account or go to [email or web address]  Consent: (Patient) Benjamin Meyer provided verbal consent for this virtual visit at the beginning of the encounter.  Current Medications:  Current Outpatient Medications:    Blood Glucose Monitoring Suppl DEVI, 1 each by Does not apply route as directed. Dispense based on patient and insurance preference. Use up to four times daily as directed. (FOR ICD-10 E10.9, E11.9)., Disp: 1 each, Rfl: 0   Blood Pressure Monitoring KIT, Use as directed to check BP daily., Disp: 1 kit, Rfl: 0   Glucose Blood (BLOOD GLUCOSE TEST STRIPS) STRP, 1 each by Does not apply route as directed. Dispense based on patient and insurance preference. Use up to four times daily as directed. (FOR ICD-10 E10.9, E11.9)., Disp: 100 strip, Rfl: 0   Lancet Device MISC, 1 each by Does not apply route as directed. Dispense based on patient and insurance preference. Use up to four times daily as directed. (FOR ICD-10 E10.9, E11.9)., Disp: 1 each, Rfl: 0   Lancets MISC, 1 each by Does not apply route as directed. Dispense based on patient and insurance preference. Use up to four times daily as directed. (FOR ICD-10 E10.9, E11.9)., Disp: 100 each, Rfl: 0   baclofen  (LIORESAL ) 10 MG tablet, Take 1 tablet (10 mg total) by mouth 3 (three) times daily., Disp: 30 each, Rfl: 0   benzonatate  (TESSALON ) 100 MG capsule, Take 1-2 capsules (100-200 mg total) by mouth 3 (three) times daily as needed., Disp: 30 capsule, Rfl: 0    colchicine  0.6 MG tablet, Take 2 tabs immediately, then 1 tab twice per day for the duration of the flare up to a max of 7 days (but discontinue for stomach pains or diarrhea), Disp: 30 tablet, Rfl: 0   fluticasone  (FLONASE ) 50 MCG/ACT nasal spray, Place 2 sprays into both nostrils daily., Disp: 16 g, Rfl: 0   ibuprofen  (ADVIL ) 800 MG tablet, Take 1 tablet (800 mg total) by mouth 3 (three) times daily., Disp: 30 tablet, Rfl: 0   losartan  (COZAAR ) 50 MG tablet, Take 1 tablet (50 mg total) by mouth daily., Disp: 15 tablet, Rfl: 0   metFORMIN  (GLUCOPHAGE ) 500 MG tablet, Take 1 tablet (500 mg total) by mouth 2 (two) times daily with a meal., Disp: 60 tablet, Rfl: 0   methocarbamol  (ROBAXIN ) 500 MG tablet, Take 1 tablet (500 mg total) by mouth 2 (two) times daily., Disp: 20 tablet, Rfl: 0   naproxen  (NAPROSYN ) 500 MG tablet, Take 1 tablet (500 mg total) by mouth 2 (two) times daily with a meal., Disp: 30 tablet, Rfl: 0   naproxen  (NAPROSYN ) 500 MG tablet, Take 1 tablet (500 mg total) by mouth 2 (two) times daily with a meal., Disp: 30 tablet, Rfl: 0   nystatin  cream (MYCOSTATIN ), Apply 1 Application topically 2 (two) times daily., Disp: 90 g, Rfl: 0   predniSONE  (DELTASONE ) 20 MG tablet, Take 2 tablets (40 mg total) by mouth daily with breakfast., Disp: 10 tablet, Rfl: 0   promethazine -dextromethorphan (PROMETHAZINE -DM) 6.25-15 MG/5ML syrup, Take 5 mLs  by mouth 4 (four) times daily as needed for cough., Disp: 118 mL, Rfl: 0   triamcinolone  ointment (KENALOG ) 0.1 %, Apply 1 Application topically 2 (two) times daily., Disp: 30 g, Rfl: 0   Medications ordered in this encounter:  Meds ordered this encounter  Medications   losartan  (COZAAR ) 50 MG tablet    Sig: Take 1 tablet (50 mg total) by mouth daily.    Dispense:  15 tablet    Refill:  0    Supervising Provider:   BLAISE ALEENE KIDD [id number]   Blood Pressure Monitoring KIT    Sig: Use as directed to check BP daily.    Dispense:  1 kit    Refill:  0     Supervising Provider:   BLAISE ALEENE KIDD [id number]   Blood Glucose Monitoring Suppl DEVI    Sig: 1 each by Does not apply route as directed. Dispense based on patient and insurance preference. Use up to four times daily as directed. (FOR ICD-10 E10.9, E11.9).    Dispense:  1 each    Refill:  0    Supervising Provider:   BLAISE ALEENE KIDD [[id number]]   Glucose Blood (BLOOD GLUCOSE TEST STRIPS) STRP    Sig: 1 each by Does not apply route as directed. Dispense based on patient and insurance preference. Use up to four times daily as directed. (FOR ICD-10 E10.9, E11.9).    Dispense:  100 strip    Refill:  0    Supervising Provider:   BLAISE ALEENE KIDD [[id number]]   Lancet Device MISC    Sig: 1 each by Does not apply route as directed. Dispense based on patient and insurance preference. Use up to four times daily as directed. (FOR ICD-10 E10.9, E11.9).    Dispense:  1 each    Refill:  0    Supervising Provider:   BLAISE ALEENE KIDD [id number]   Lancets MISC    Sig: 1 each by Does not apply route as directed. Dispense based on patient and insurance preference. Use up to four times daily as directed. (FOR ICD-10 E10.9, E11.9).    Dispense:  100 each    Refill:  0    Supervising Provider:   BLAISE ALEENE KIDD [[id number]]     *If you need refills on other medications prior to your next appointment, please contact your pharmacy*  Follow-Up: Call back or seek an in-person evaluation if the symptoms worsen or if the condition fails to improve as anticipated.  [facility] [phone]  Other Instructions Make sure to follow-up with your new PCP as scheduled next week!   If you have been instructed to have an in-person evaluation today at a local Urgent Care facility, please use the link below. It will take you to a list of all of our available [facility], including address, phone number and hours of operation. Please do not delay care.  [facility]  If you  or a family member do not have a primary care provider, use the link below to schedule a visit and establish care. When you choose a [facility] primary care physician or advanced practice provider, you gain a long-term partner in health. Find a Primary Care Provider  Learn more about [facility]'s in-office and virtual care options: [facility] - Get Care Now

## 2024-06-08 ENCOUNTER — Ambulatory Visit: Payer: Self-pay | Admitting: Family Medicine

## 2024-06-08 ENCOUNTER — Telehealth: Payer: Self-pay | Admitting: General Practice

## 2024-06-08 NOTE — Telephone Encounter (Signed)
 Called pt to r/s appt due to provider being out today. Pt stated he would call back to r/s then line went silent.
